# Patient Record
Sex: Male | Born: 1950 | Race: White | Hispanic: No | Marital: Married | Smoking: Never smoker
Health system: Southern US, Community
[De-identification: ages and names within clinical notes are randomized; demographics above are authoritative.]

## PROBLEM LIST (undated history)

## (undated) DIAGNOSIS — N401 Enlarged prostate with lower urinary tract symptoms: Secondary | ICD-10-CM

## (undated) DIAGNOSIS — N411 Chronic prostatitis: Secondary | ICD-10-CM

## (undated) DIAGNOSIS — I10 Essential (primary) hypertension: Secondary | ICD-10-CM

## (undated) DIAGNOSIS — N529 Male erectile dysfunction, unspecified: Secondary | ICD-10-CM

## (undated) DIAGNOSIS — N400 Enlarged prostate without lower urinary tract symptoms: Secondary | ICD-10-CM

## (undated) DIAGNOSIS — E785 Hyperlipidemia, unspecified: Secondary | ICD-10-CM

## (undated) HISTORY — DX: Benign prostatic hyperplasia with lower urinary tract symptoms: N40.1

## (undated) HISTORY — DX: Essential (primary) hypertension: I10

## (undated) HISTORY — DX: Chronic prostatitis: N41.1

## (undated) HISTORY — DX: Male erectile dysfunction, unspecified: N52.9

## (undated) HISTORY — DX: Hyperlipidemia, unspecified: E78.5

## (undated) HISTORY — DX: Benign prostatic hyperplasia without lower urinary tract symptoms: N40.0

---

## 2004-11-24 ENCOUNTER — Emergency Department: Payer: Self-pay | Admitting: Emergency Medicine

## 2004-11-24 ENCOUNTER — Other Ambulatory Visit: Payer: Self-pay

## 2005-04-28 HISTORY — PX: ELBOW ARTHROSCOPY: SUR87

## 2006-07-27 ENCOUNTER — Ambulatory Visit: Payer: Self-pay | Admitting: Chiropractic Medicine

## 2006-10-02 ENCOUNTER — Ambulatory Visit: Payer: Self-pay | Admitting: Cardiology

## 2007-01-25 ENCOUNTER — Ambulatory Visit (HOSPITAL_COMMUNITY): Admission: RE | Admit: 2007-01-25 | Discharge: 2007-01-25 | Payer: Self-pay | Admitting: Neurosurgery

## 2007-04-29 HISTORY — PX: CERVICAL DISC SURGERY: SHX588

## 2008-05-20 ENCOUNTER — Encounter: Admission: RE | Admit: 2008-05-20 | Discharge: 2008-05-20 | Payer: Self-pay | Admitting: Neurosurgery

## 2008-05-24 ENCOUNTER — Ambulatory Visit (HOSPITAL_COMMUNITY): Admission: RE | Admit: 2008-05-24 | Discharge: 2008-05-25 | Payer: Self-pay | Admitting: Neurosurgery

## 2008-09-06 ENCOUNTER — Encounter: Payer: Self-pay | Admitting: Internal Medicine

## 2008-09-27 ENCOUNTER — Encounter: Admission: RE | Admit: 2008-09-27 | Discharge: 2008-09-27 | Payer: Self-pay | Admitting: Neurosurgery

## 2009-04-28 HISTORY — PX: INGUINAL HERNIA REPAIR: SUR1180

## 2009-07-30 ENCOUNTER — Ambulatory Visit: Payer: Self-pay | Admitting: Surgery

## 2009-08-06 ENCOUNTER — Ambulatory Visit: Payer: Self-pay | Admitting: Surgery

## 2010-02-14 DIAGNOSIS — N401 Enlarged prostate with lower urinary tract symptoms: Secondary | ICD-10-CM | POA: Insufficient documentation

## 2010-02-14 DIAGNOSIS — N411 Chronic prostatitis: Secondary | ICD-10-CM

## 2010-02-14 HISTORY — DX: Benign prostatic hyperplasia with lower urinary tract symptoms: N40.1

## 2010-02-14 HISTORY — DX: Chronic prostatitis: N41.1

## 2010-04-28 HISTORY — PX: HYDROCELE EXCISION: SHX482

## 2010-08-12 LAB — CBC
HCT: 44.7 % (ref 39.0–52.0)
Hemoglobin: 15.2 g/dL (ref 13.0–17.0)
MCV: 92.6 fL (ref 78.0–100.0)
WBC: 5.9 10*3/uL (ref 4.0–10.5)

## 2010-08-12 LAB — BASIC METABOLIC PANEL
BUN: 15 mg/dL (ref 6–23)
Chloride: 107 mEq/L (ref 96–112)
GFR calc non Af Amer: >60 mL/min (ref >60)
Glucose, Bld: 99 mg/dL (ref 70–99)
Potassium: 4.5 mEq/L (ref 3.5–5.1)
Sodium: 139 mEq/L (ref 135–145)

## 2010-09-05 ENCOUNTER — Other Ambulatory Visit: Payer: Self-pay | Admitting: *Deleted

## 2010-09-05 MED ORDER — LISINOPRIL 20 MG PO TABS: 20.0000 mg | ORAL_TABLET | Freq: Every day | ORAL | Status: AC

## 2010-09-10 NOTE — Assessment & Plan Note (Signed)
[facility] OFFICE NOTE   TYRE, BEAVER                      MRN:          [id number]  DATE:10/02/2006                            DOB:          1950-12-07    Mr. Gregory Gilbert is a 60 year old married white male, who comes self  referred through a patient of mine, Dr. Maricela Curet, for establish  with me as his cardiologist.   He had an evaluation 2 years ago at [facility] with Dr. Arnoldo Hooker.  This was for funny feelings in his chest.  These were really  described as palpitations and dizziness.  He also had a lot of burping  and eructation.   He had, apparently, an endoscopy that was negative.  He was put on  Prilosec, which cured this.   He also had a stress echocardiogram by Dr. Gwen Pounds on December 06, 2004.  He exercised for 13 minutes and 41 seconds, achieving 15.1 METS.  Blood  pressure peaked at 173/102.  His EF  was 50%.  No wall motion  abnormalities, and his peak stress EF was 60%.  He had no EKG changes or  any abnormality of his heart rhythm.  It was interpreted as a negative  adequate study.   He has been doing remarkably well.  He has been under a lot of stress  lately with some family issues.  He has noticed that his blood pressure  has been good, but today is up.   He has had some orthopedic issues of his right elbow from racquetball,  which has made him not exercise quite a much as usual.  He does use a  recumbent bike.   His only risk factors at this point in time are his age, sex, and  hypertension.  He does smoke.  Does not have hyperlipidemia, and is not  diabetic.   He has no premature history of coronary disease in his family.   PAST MEDICAL HISTORY:  HE IS INTOLERANCE OF SULFA.   CURRENT MEDICATIONS:  1. Aspirin 81 mg a day.  2. Lisinopril 20 mg a day.  3. Protonix 40 mg a day.  4. Levoxadrol.   SURGICAL HISTORY:  Includes a right elbow repair at [facility] in  2007 from  racquetball.   SOCIAL HISTORY:  He is a Human resources officer.  He is married, and has 2  children.  He recently moved in to a new home.  He has been under a lot  of stress from family stresses.   REVIEW OF SYSTEMS:  Other than the HPI, is negative.  He does experience  some sexual dysfunction, and is on Levitra.   EXAMINATION:  His blood pressure is 142/82 in the left arm with a large  cuff.  I repeated it, it was still about 138 to 140 over about 85.  His  pulse is 54 in sinus brady.  His weight is 187.  He is 6 feet 2 inches.  His EKG is normal, except for a slight rightward axis.  He is very athletic looking.  HEENT:  Normocephalic,  atraumatic.  PERRLA.  Extraocular movements  intact.  Skin somewhat freckled, and he has a little bit of a  erythematous skin color.  Sclerae are clear.  Facial symmetry is normal.  Dentition is satisfactory.  Carotid upstrokes are equal bilaterally without bruits.  No JVD.  Thyroid is not enlarged.  Trachea is midline.  LUNGS:  Clear.  HEART:  Reveals a normal S1 and S2 without murmur, rub, or gallop.  ABDOMINAL EXAM:  Soft with good bowel sounds.  No tenderness.  EXTREMITIES:  Reveal no cyanosis, clubbing, or edema.  Pulses are  intact.   ASSESSMENT AND PLAN:  1. History of palpitations, normal exercise rest/stress echo, and      resolution with Protonix.  He also had a significant amount of      burping and belching, which is also resolved with Protonix.  2. Hypertension.  He has been under a lot of stress recently, and not      exercising quite a much.  I am concerned that he does not have      adequate control.  3. Gastroesophageal reflux.   RECOMMENDATIONS:  1. I renewed his lisinopril 20 mg a day per his request.  2. Blood pressure checks with a large cuff.  Goal is 120/80, anything      above 130/135, he should call us.  3. Get back in to an aerobic exercise program.  4. I will see him back otherwise in a year, or p.r.n.      Thomas C. Daleen Squibb, MD, [facility]  Electronically Signed    TCW/MedQ  DD: 10/02/2006  DT: 10/02/2006  Job #: [id number]   cc:   Fidela Juneau, MD

## 2010-09-10 NOTE — Op Note (Signed)
NAME:  Gregory Gilbert, Gregory Gilbert NO.:  [id number]   MEDICAL RECORD NO.:  [id number]          PATIENT TYPE:  OIB   LOCATION:  3534                         FACILITY:  [facility]   PHYSICIAN:  Cristi Loron, M.D.DATE OF BIRTH:  10-07-1950   DATE OF PROCEDURE:  05/24/2008  DATE OF DISCHARGE:  05/25/2008                               OPERATIVE REPORT   BRIEF HISTORY:  The patient is a 60 year old white male who has suffered  from neck and right arm pain consistent with a right C7 radiculopathy.  He failed medical management was worked up with a cervical MRI which  demonstrated herniated disk at C6-7 on the right.  I discussed the  various treatment options with the patient including surgery.  He has  weighed the risks, benefits and alternatives of surgery.  Decided to  proceed with a C6-7 anterior cervical diskectomy, fusion and plating.   PREOPERATIVE DIAGNOSES:  C6-7 herniated nucleus pulposus, spinal  stenosis, cervical radiculopathy, and cervicalgia.   POSTOPERATIVE DIAGNOSES:  C6-7 herniated nucleus pulposus, spinal  stenosis, cervical radiculopathy, and cervicalgia.   PROCEDURE:  C6-7 extensive anterior cervical diskectomy/decompression;  C6-7 anterior interbody arthrodesis with local morselized autograft bone  and Actifuse bone graft extender; insertion of C6-7 interbody prosthesis  (Neville PEEK interbody prosthesis); C6-7 anterior cervical plating with  CODMAN SLIM-LOC titanium plate and screws.   SURGEON:  Cristi Loron, MD   ASSISTANT:  Payton Doughty, MD   ANESTHESIA:  General endotracheal.   ESTIMATED BLOOD LOSS:  50 mL.   SPECIMENS:  None.   DRAINS:  None.   COMPLICATIONS:  None.   DESCRIPTION OF PROCEDURE:  The patient was brought to the operating room  by the Anesthesia Team.  General endotracheal anesthesia was induced.  The patient  remained in the supine position.  A roll was placed under  his shoulders placing the neck in slight extension.   His anterior  cervical region was then prepared with Betadine scrub and Betadine  solution.  Sterile drapes were applied.  I then injected the area to be  incised with Marcaine with epinephrine solution.  I used a scalpel to  make a transverse incision.  In the patient's left anterior neck, I used  the Metzenbaum scissors to divide the platysma muscle and then to  dissect medial to the sternocleidomastoid muscle, jugular vein and  carotid artery.  I carefully dissected down towards the anterior  cervical spine identifying the esophagus and retracting it medially.  I  used Kittner swabs to clear the soft tissue from the anterior cervical  spine.  We then inserted a bent spinal needle into the upper exposed  intervertebral disk space.  We obtained intraoperative radiograph to  confirm our location.   We then used electrocautery to detach the medial border of the longus  colli muscle bilaterally from the C6-7 intervertebral disk space.  We  then inserted the Caspar self-retaining retractor underneath the longus  colli muscle bilaterally to provide exposure.  We began the  decompression by incising the C6-7 intervertebral disk with the 15 blade  scalpel to perform a  partial intervertebral diskectomy using the  pituitary forceps and the Carlens curettes.  We inserted the distraction  screws at C6 and C7 and distracted the interspace.  We  then used a high-  speed drill to decorticate the vertebral endplates at C6-7, drilled away  the remainder of C6-7 intervertebral disk to  drill away some posterior  spondylosis and to thin out the posterior longitudinal ligament.  We  then incised the ligament with arachnoid knife and then removed it with  Kerrison punch undercutting the vertebral endplates decompressing the  thecal sac.  We then performed a foraminotomy about bilateral C7 nerve  roots completing the decompression.  I should also mention that we did  encounter the expected right-sided  herniated disk at C6-7 compressing  the right C7 nerve root.   Having completed the decompression, we now turned our attention to the  arthrodesis.  We used trial spacers and determined to use a 6-mm medium  Neville interbody prosthesis.  We prefilled this prosthesis with a  combination of local autograft bone that we obtained during  decompression as well as Actifuse bone graft extender.  We then placed  the prosthesis into distracted C6-7 interspace and then removed the  distraction screws.  There was a good snug fit of the prosthesis in the  interspace.   We now turned our attention to the anterior spinal instrumentation.  We  used the high-speed drill to remove some ventral spondylosis so the  plate would lay down flat.  We then selected appropriate length CODMAN  SLIM-LOC anterior cervical plate.  We laid it along the anterior aspect  of the vertebral bodies at C6-7.  We then drilled two 14-mm holes at C6  through C7.  We then secured the plate to the vertebral bodies by  placing two 14-mm self-tapping screws at C6 through C7.  We then  obtained intraoperative radiograph which demonstrated good position of  the plate, screws and interbody prosthesis.  We therefore secured these  screws and plate by locking each cam.   We then obtained hemostasis using bipolar cautery.  We irrigated the  wound out with bacitracin solution.  We then removed the retractor and  then inspected the esophagus for any damage, there was none apparent.  We then reapproximated the patient's platysma muscle with interrupted 3-  0 Vicryl suture, the subcutaneous tissue with interrupted 3-0 Vicryl  suture and the skin with Steri-Strips and benzoin.  The wound was then  coated with bacitracin ointment and sterile dressing was applied.  The  drapes were removed and the patient was subsequently extubated by the  Anesthesia Team and transported to the postanesthesia care unit in  stable condition.  All sponge,  instrument and needle counts were correct  at the end of this case.      Cristi Loron, M.D.  Electronically Signed     JDJ/MEDQ  D:  05/25/2008  T:  05/26/2008  Job:  [id number]

## 2010-10-10 ENCOUNTER — Encounter: Payer: Self-pay | Admitting: Internal Medicine

## 2011-01-04 IMAGING — CR DG CERVICAL SPINE 2 OR 3 VIEWS
1 series · 1 of 1 positions shown · non-contrast
Comparison: 05/20/2008.

CLINICAL DATA: C6-7 ACDF.

CERVICAL SPINE - 2-3 VIEW

[view not recorded]
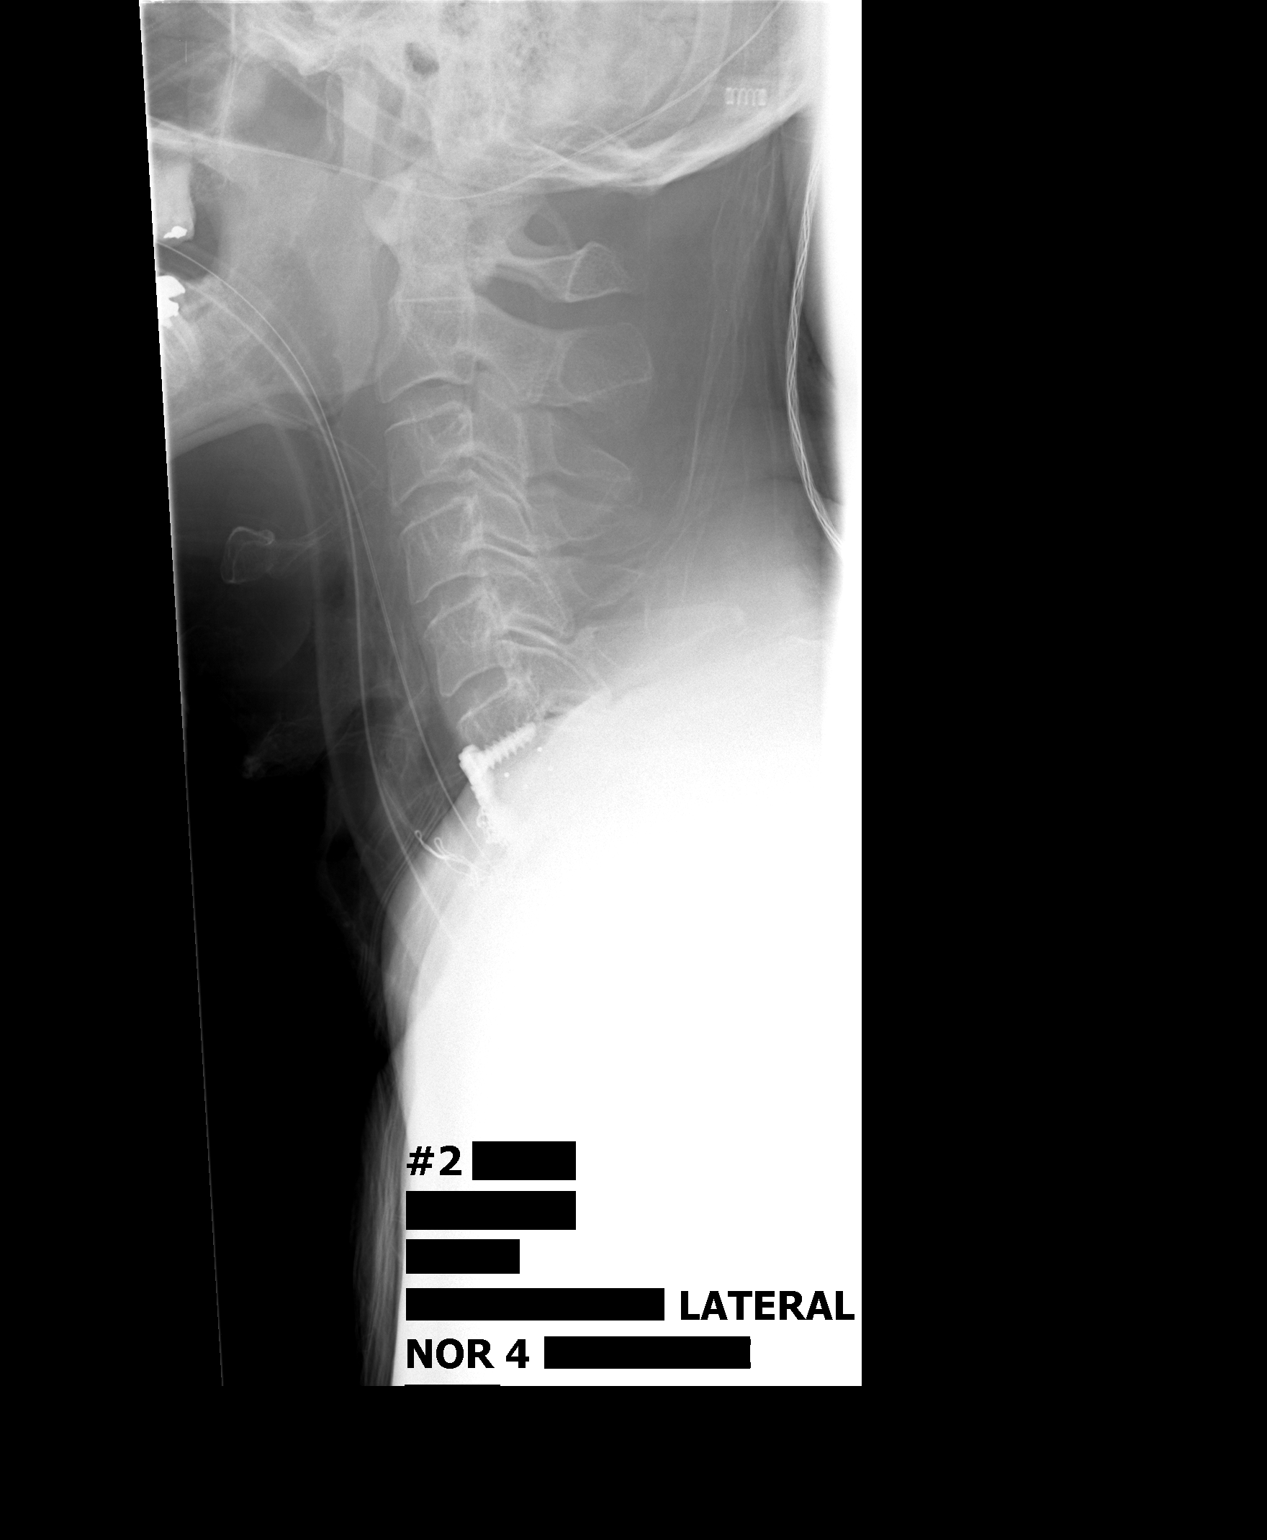

[1 of 1 positions shown; findings below may reference images not displayed]

FINDINGS: Image #1 reveals a needle at the C5-6 interspace.

Image #2 reveals anterior plate and interbody graft fusion at C6-7.
C7 is not well visualized due to overlying shoulders.
IMPRESSION: ACDF of C6-7.

## 2011-10-01 ENCOUNTER — Telehealth (HOSPITAL_COMMUNITY): Payer: Self-pay | Admitting: *Deleted

## 2011-10-01 NOTE — Telephone Encounter (Signed)
Leslie from [facility] called. Gregory Gilbert is in need of a refill on his medication, lisinopril 20mg  to be sent over to them.  Please call them back. Thanks.

## 2011-10-03 NOTE — Telephone Encounter (Signed)
Spoke [facility] advised pt was last seen by Dr Daleen Squibb in [place] office in 2008 we can not provide refills

## 2012-06-16 ENCOUNTER — Ambulatory Visit: Payer: Self-pay | Admitting: Urology

## 2012-06-23 ENCOUNTER — Ambulatory Visit: Payer: Self-pay | Admitting: Urology

## 2013-09-21 DIAGNOSIS — N529 Male erectile dysfunction, unspecified: Secondary | ICD-10-CM

## 2013-09-21 DIAGNOSIS — I10 Essential (primary) hypertension: Secondary | ICD-10-CM

## 2013-09-21 DIAGNOSIS — E785 Hyperlipidemia, unspecified: Secondary | ICD-10-CM

## 2013-09-21 HISTORY — DX: Essential (primary) hypertension: I10

## 2013-09-21 HISTORY — DX: Hyperlipidemia, unspecified: E78.5

## 2013-09-21 HISTORY — DX: Male erectile dysfunction, unspecified: N52.9

## 2013-09-22 DIAGNOSIS — N400 Enlarged prostate without lower urinary tract symptoms: Secondary | ICD-10-CM

## 2013-09-22 HISTORY — DX: Benign prostatic hyperplasia without lower urinary tract symptoms: N40.0

## 2014-07-28 ENCOUNTER — Ambulatory Visit
Admit: 2014-07-28 | Disposition: A | Discharge status: Home / Self Care | Payer: Self-pay | Attending: Unknown Physician Specialty | Admitting: Unknown Physician Specialty

## 2014-08-18 NOTE — Op Note (Signed)
PATIENT NAME:  Gregory Gilbert, Gregory Gilbert MR#:  [id number] DATE OF BIRTH:  [birth year]  DATE OF PROCEDURE:  06/23/2012  PREOPERATIVE DIAGNOSIS:  Right hydrocele.   POSTOPERATIVE DIAGNOSIS:   Right hydrocele.  POSTOPERATIVE DIAGNOSIS: Right hydrocelectomy.   SURGEON: Irineo AxonScott Stoioff, M.D.   ASSISTANT:  None.   ANESTHESIA:  General.   INDICATIONS:  The patient is a 64 year old male who developed a right hydrocele after an inguinal hernia repair. It had increased in size and is moderately symptomatic. He initially elected aspiration in the office, which was performed approximately 3 weeks ago. He states he had reaccumulation of the fluid within 72 hours. He has elected hydrocelectomy.   DESCRIPTION OF PROCEDURE: He was taken to the operating room where a general anesthetic was administered. He was then placed in the supine position and his external genitalia were prepped and draped in the usual fashion. Timeout was performed. A transverse incision was made in the midportion of the right hemiscrotum and carried through dartos fascia. The incision was carried down to the hydrocele sac. There was some adherence of the sac to the scrotal walls which was removed with blunt dissection and cautery. The hydrocele sac was then delivered in the operative field. It was incised anteriorly and approximately 200 mL of straw-colored fluid was aspirated. The hydrocele sac was opened longitudinally. The right testis was normal in appearance. The hydrocele sac was excised with a harmonic scalpel leaving a rim of approximately 1 cm on the cord structures and epididymis. The sac edges were in inverted and loosely approximated with 3-0 chromic suture. Hemostasis was active. The scrotal walls were examined and hemostasis was adequate.   The right testis was then delivered back in the right hemiscrotum in its anatomic position. Scrotum was irrigated with saline. A quarter-inch Penrose drain was placed in the dependent portion of the  right hemiscrotum through a separate stab incision and secured with 3-0 chromic. The skin edges were anesthetized with 8 mL of 0.5% plain Sensorcaine. The dartos was closed with a running 3-0 chromic suture. Skin was closed with a running horizontal mattress suture of 3-0 chromic. Dressing of Telfa and fluffs was applied. The patient was taken to the PAC-U in stable condition. There were no complications.   ESTIMATED BLOOD LOSS:  Minimal.    ____________________________ Verna CzechScott C. Lonna CobbStoioff, MD scs:ct D: 06/23/2012 11:16:43 ET T: 06/23/2012 11:36:28 ET JOB#: [id number]  cc: Lorin PicketScott C. Lonna CobbStoioff, MD, <Dictator> Riki AltesSCOTT C STOIOFF MD ELECTRONICALLY SIGNED 07/07/2012 7:44

## 2014-08-21 LAB — SURGICAL PATHOLOGY

## 2017-02-16 ENCOUNTER — Ambulatory Visit (INDEPENDENT_AMBULATORY_CARE_PROVIDER_SITE_OTHER): Payer: Medicare Other | Admitting: Urology

## 2017-02-16 ENCOUNTER — Encounter: Payer: Self-pay | Admitting: Urology

## 2017-02-16 VITALS — BP 138/77 | HR 65 | Ht 73.0 in | Wt 185.0 lb

## 2017-02-16 DIAGNOSIS — N400 Enlarged prostate without lower urinary tract symptoms: Secondary | ICD-10-CM

## 2017-02-16 DIAGNOSIS — N401 Enlarged prostate with lower urinary tract symptoms: Secondary | ICD-10-CM

## 2017-02-16 LAB — BLADDER SCAN AMB NON-IMAGING

## 2017-02-16 MED ORDER — TADALAFIL 5 MG PO TABS: 6.0000 mg | ORAL_TABLET | Freq: Every day | ORAL | 3 refills | Status: DC

## 2017-02-17 NOTE — Progress Notes (Signed)
02/16/2017 8:18 AM   Gregory Gilbert R Gilbert [birth year] [id number]   Chief Complaint  Patient presents with  . Benign Prostatic Hypertrophy    1year    HPI: 66 year old male presents for annual follow-up BPH.  [facility] and he noted significant worsening of his voiding symptoms after his insurance company was no longer covering daily low-dose tadalafil.  He has been obtaining this medication through [place] [facility] and did note significant improvement in his voiding pattern.  He remains on tamsulosin.  He presently has no bothersome lower urinary tract symptoms.  Denies dysuria or gross hematuria.  Denies flank, abdominal, pelvic or scrotal pain.  [facility]  PMH: Past Medical History:  Diagnosis Date  . Benign essential hypertension 09/21/2013  . Chronic prostatitis 02/14/2010  . 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 Enlarged prostate with lower urinary tract symptoms (LUTS) 02/14/2010  . Erectile dysfunction 09/21/2013  . Hyperlipemia 09/21/2013  . Hypertrophy of prostate without urinary obstruction and other lower urinary tract symptoms (LUTS) 09/22/2013    Surgical History: Past Surgical History:  Procedure Laterality Date  . CERVICAL DISC SURGERY  2009   6&7  . ELBOW ARTHROSCOPY Right 2007  . HYDROCELE EXCISION  2012  . INGUINAL HERNIA REPAIR  2011    Home Medications:  Allergies as of 02/16/2017      Reactions   Sulfa Antibiotics Nausea Only      Medication List       Accurate as of 02/16/17 11:59 PM. Always use your most recent med list.          aspirin EC 81 MG tablet Take 81 mg by mouth.   L-Lysine 500 MG Tabs Take by mouth.   lisinopril 20 MG tablet Commonly known as:  PRINIVIL,ZESTRIL Take 1 tablet (20 mg total) by mouth daily.   simvastatin 20 MG tablet Commonly known as:  ZOCOR   tadalafil 5 MG tablet Commonly known as:  CIALIS Take 1 tablet (5 mg total) by mouth  daily.   tamsulosin 0.4 MG Caps capsule Commonly known as:  FLOMAX   vitamin B-12 1000 MCG tablet Commonly known as:  CYANOCOBALAMIN Take by mouth.       Allergies:  Allergies  Allergen Reactions  . Sulfa Antibiotics Nausea Only    Family History: Family History  Problem Relation Age of Onset  . Bladder Cancer Neg Hx   . Kidney cancer Neg Hx   . Prostate cancer Neg Hx     Social History:  reports that he has never smoked. He has never used smokeless tobacco. He reports that he drinks alcohol. He reports that he does not use drugs.  ROS: UROLOGY Frequent Urination?: No Hard to postpone urination?: No Burning/pain with urination?: No Get up at night to urinate?: Yes Leakage of urine?: Yes Urine stream starts and stops?: No Trouble starting stream?: No Do you have to strain to urinate?: No Blood in urine?: No Urinary tract infection?: No Sexually transmitted disease?: No Injury to kidneys or bladder?: No Painful intercourse?: No Weak stream?: Yes Erection problems?: Yes Penile pain?: No  Gastrointestinal Nausea?: No Vomiting?: No Indigestion/heartburn?: No Diarrhea?: No Constipation?: No  Constitutional Fever: No Night sweats?: No Weight loss?: No Fatigue?: No  Skin Skin rash/lesions?: No Itching?: No  Eyes Blurred vision?: No Double vision?: No  Ears/Nose/Throat Sore throat?: No Sinus problems?: No  Hematologic/Lymphatic Swollen glands?: No Easy bruising?: No  Cardiovascular  Leg swelling?: No Chest pain?: No  Respiratory Cough?: No Shortness of breath?: No  Endocrine Excessive thirst?: No  Musculoskeletal Back pain?: No Joint pain?: No  Neurological Headaches?: No Dizziness?: No  Psychologic Depression?: No Anxiety?: No  Physical Exam: BP 138/77   Pulse 65   Ht 6\' 1"  (1.854 m)   Wt 185 lb (83.9 kg)   BMI 24.41 kg/m   Constitutional:  Alert and oriented, No acute distress. HEENT: [place] AT, moist mucus membranes.   Trachea midline, no masses. Cardiovascular: No clubbing, cyanosis, or edema. Respiratory: Normal respiratory effort, no increased work of breathing. GI: Abdomen is soft, nontender, nondistended, no abdominal masses GU: No CVA tenderness.  Prostate 50 g, smooth without nodules Skin: No rashes, bruises or suspicious lesions. Lymph: No cervical or inguinal adenopathy. Neurologic: Grossly intact, no focal deficits, moving all 4 extremities. Psychiatric: Normal mood and affect.  Laboratory Data: Lab Results  Component Value Date   WBC 5.9 05/24/2008   HGB 15.2 05/24/2008   HCT 44.7 05/24/2008   MCV 92.6 05/24/2008   PLT 171 05/24/2008    Lab Results  Component Value Date   CREATININE 0.81 05/24/2008     Assessment & Plan:   1. Benign prostatic hyperplasia with lower urinary tract symptoms, symptom details unspecified  Stable lower urinary tract symptoms on tamsulosin and tadalafil.  PVR by bladder scan today was 11 mL.  He was interested in obtaining extended release tadalafil 6 mg from [employer]. Continue annual follow-up.  - BLADDER SCAN AMB NON-IMAGING   Return in about 1 year (around 02/16/2018).  Riki Altes, MD  [facility] [address] [place], [place] [address] [phone]

## 2017-07-10 ENCOUNTER — Telehealth: Payer: Self-pay | Admitting: Urology

## 2017-07-10 NOTE — Telephone Encounter (Signed)
Pt called and has a few pills left.  He needs a refill.  [facility] [address] tried to [facility]  Can you please send refill to [facility] [address].

## 2017-07-14 MED ORDER — TAMSULOSIN HCL 0.4 MG PO CAPS: 0.4000 mg | ORAL_CAPSULE | Freq: Every day | ORAL | 11 refills | Status: DC

## 2017-07-14 NOTE — Telephone Encounter (Signed)
Flomax sent to [facility]

## 2017-12-02 ENCOUNTER — Ambulatory Visit (INDEPENDENT_AMBULATORY_CARE_PROVIDER_SITE_OTHER): Payer: Medicare Other | Admitting: Urology

## 2017-12-02 ENCOUNTER — Encounter: Payer: Self-pay | Admitting: Urology

## 2017-12-02 VITALS — BP 146/70 | HR 67 | Ht 74.0 in | Wt 190.0 lb

## 2017-12-02 DIAGNOSIS — N138 Other obstructive and reflux uropathy: Secondary | ICD-10-CM

## 2017-12-02 DIAGNOSIS — N401 Enlarged prostate with lower urinary tract symptoms: Secondary | ICD-10-CM

## 2017-12-03 ENCOUNTER — Encounter: Payer: Self-pay | Admitting: Urology

## 2017-12-03 NOTE — Progress Notes (Signed)
12/02/2017 9:19 AM   Gregory Gilbert 1950-12-01 [id number]  Referring provider: Kandyce Rud, MD [phone] [address] [facility] - [facility] Medicine [place], [place] [address]  Chief Complaint  Patient presents with  . Benign Prostatic Hypertrophy    Urolift discussion    HPI: 67 year old male followed for a long history of BPH with severe lower urinary tract symptoms.  He is presently on tamsulosin and compounded tadalafil and presents today to discuss UroLift.  His present voiding symptoms include decreased force and caliber of his urinary stream, urinary hesitancy and variable nocturia from 1-5.  He also complains of post void dribbling.  Denies dysuria or gross hematuria.  Denies flank, abdominal, pelvic or scrotal pain.  IPSS completed today was 20 with a quality of life rated 4/6.  Last PSA December 2018 was 0.85   PMH: Past Medical History:  Diagnosis Date  . Benign essential hypertension 09/21/2013  . Chronic prostatitis 02/14/2010  . 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 Enlarged prostate with lower urinary tract symptoms (LUTS) 02/14/2010  . Erectile dysfunction 09/21/2013  . Hyperlipemia 09/21/2013  . Hypertrophy of prostate without urinary obstruction and other lower urinary tract symptoms (LUTS) 09/22/2013    Surgical History: Past Surgical History:  Procedure Laterality Date  . CERVICAL DISC SURGERY  2009   6&7  . ELBOW ARTHROSCOPY Right 2007  . HYDROCELE EXCISION  2012  . INGUINAL HERNIA REPAIR  2011    Home Medications:  Allergies as of 12/02/2017      Reactions   Sulfa Antibiotics Nausea Only      Medication List        Accurate as of 12/02/17 11:59 PM. Always use your most recent med list.          aspirin EC 81 MG tablet Take 81 mg by mouth.   L-Lysine 500 MG Tabs Take by mouth.   lisinopril 20 MG tablet Commonly known as:  PRINIVIL,ZESTRIL Take 1 tablet (20 mg total) by mouth daily.   simvastatin 20 MG  tablet Commonly known as:  ZOCOR   tadalafil 5 MG tablet Commonly known as:  CIALIS Take 1 tablet (5 mg total) by mouth daily.   tamsulosin 0.4 MG Caps capsule Commonly known as:  FLOMAX Take 1 capsule (0.4 mg total) by mouth daily.   vitamin B-12 1000 MCG tablet Commonly known as:  CYANOCOBALAMIN Take by mouth.       Allergies:  Allergies  Allergen Reactions  . Sulfa Antibiotics Nausea Only    Family History: Family History  Problem Relation Age of Onset  . Bladder Cancer Neg Hx   . Kidney cancer Neg Hx   . Prostate cancer Neg Hx     Social History:  reports that he has never smoked. He has never used smokeless tobacco. He reports that he drinks alcohol. He reports that he does not use drugs.  ROS: UROLOGY Frequent Urination?: Yes Hard to postpone urination?: No Burning/pain with urination?: No Get up at night to urinate?: Yes Leakage of urine?: Yes Urine stream starts and stops?: No Trouble starting stream?: No Do you have to strain to urinate?: No Blood in urine?: No Urinary tract infection?: No Sexually transmitted disease?: No Injury to kidneys or bladder?: No Painful intercourse?: No Weak stream?: No Erection problems?: No Penile pain?: No  Gastrointestinal Nausea?: No Vomiting?: No Indigestion/heartburn?: No Diarrhea?: No Constipation?: No  Constitutional Fever: No Night sweats?: No Weight loss?: No Fatigue?: No  Skin Skin rash/lesions?: No Itching?: No  Eyes Blurred vision?: No Double vision?: No  Ears/Nose/Throat Sore throat?: No Sinus problems?: No  Hematologic/Lymphatic Swollen glands?: No Easy bruising?: No  Cardiovascular Leg swelling?: No Chest pain?: No  Respiratory Cough?: No Shortness of breath?: No  Endocrine Excessive thirst?: No  Musculoskeletal Back pain?: No Joint pain?: No  Neurological Headaches?: No Dizziness?: No  Psychologic Depression?: No Anxiety?: No  Physical Exam: BP (!) 146/70    Pulse 67   Ht 6\' 2"  (1.88 m)   Wt 190 lb (86.2 kg)   BMI 24.39 kg/m   Constitutional:  Alert and oriented, No acute distress. HEENT: [place] AT, moist mucus membranes.  Trachea midline, no masses. Cardiovascular: No clubbing, cyanosis, or edema.  RRR Respiratory: Normal respiratory effort, no increased work of breathing.  Lungs clear GI: Abdomen is soft, nontender, nondistended, no abdominal masses GU: No CVA tenderness Lymph: No cervical or inguinal lymphadenopathy. Skin: No rashes, bruises or suspicious lesions. Neurologic: Grossly intact, no focal deficits, moving all 4 extremities. Psychiatric: Normal mood and affect.   Assessment & Plan:   67 year old male with BPH and severe lower urinary tract symptoms on tamsulosin and tadalafil.  I discussed UroLift in detail including the procedure and the most common side effects of urinary frequency, urgency, dysuria and hematuria.  The possibility of persistent symptoms and rare chance of urinary retention was discussed.  He was informed the incidence of retrograde ejaculation is low.  He would like to think over and if he desires to pursue will schedule for cystoscopy/TRUS.   Riki AltesScott C Dillion Stowers, MD  [facility] [address], [address] [place], [place] [id number] [phone]

## 2017-12-07 ENCOUNTER — Encounter: Payer: Self-pay | Admitting: Urology

## 2017-12-10 ENCOUNTER — Telehealth: Payer: Self-pay | Admitting: Radiology

## 2017-12-10 NOTE — Telephone Encounter (Signed)
Reached out to patient regarding decision to pursue Urolift. Patient would like to discuss with wife & will call back with decision.

## 2018-02-15 ENCOUNTER — Ambulatory Visit: Payer: Medicare Other | Admitting: Urology

## 2018-02-16 ENCOUNTER — Encounter: Payer: Self-pay | Admitting: Urology

## 2018-02-16 ENCOUNTER — Ambulatory Visit (INDEPENDENT_AMBULATORY_CARE_PROVIDER_SITE_OTHER): Payer: Medicare Other | Admitting: Urology

## 2018-02-16 VITALS — BP 155/77 | HR 65 | Ht 74.0 in | Wt 192.4 lb

## 2018-02-16 DIAGNOSIS — N138 Other obstructive and reflux uropathy: Secondary | ICD-10-CM

## 2018-02-16 DIAGNOSIS — N401 Enlarged prostate with lower urinary tract symptoms: Secondary | ICD-10-CM | POA: Diagnosis not present

## 2018-02-16 LAB — BLADDER SCAN AMB NON-IMAGING

## 2018-02-16 NOTE — Progress Notes (Signed)
02/16/2018 2:52 PM   Gregory Gilbert [birth year] [id number]  Referring provider: Kandyce Rud, MD [phone] [address] [facility] - Family and Internal Medicine [place], [place] [address]  Chief Complaint  Patient presents with  . Benign Prostatic Hypertrophy    HPI: 67 year old male presents for annual follow-up of BPH.  He did make an appointment and August 2019 because he was interested in UroLift.  The procedure was discussed and he decided to hold off.  He states his voiding symptoms are stable but complains of decreased force and caliber of his urinary stream, hesitancy, postvoid dribbling and variable nocturia 2 times per night on average.   PMH: Past Medical History:  Diagnosis Date  . Benign essential hypertension 09/21/2013  . Chronic prostatitis 02/14/2010  . 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 Enlarged prostate with lower urinary tract symptoms (LUTS) 02/14/2010  . Erectile dysfunction 09/21/2013  . Hyperlipemia 09/21/2013  . Hypertrophy of prostate without urinary obstruction and other lower urinary tract symptoms (LUTS) 09/22/2013    Surgical History: Past Surgical History:  Procedure Laterality Date  . CERVICAL DISC SURGERY  2009   6&7  . ELBOW ARTHROSCOPY Right 2007  . HYDROCELE EXCISION  2012  . INGUINAL HERNIA REPAIR  2011    Home Medications:  Allergies as of 02/16/2018      Reactions   Sulfa Antibiotics Nausea Only      Medication List        Accurate as of 02/16/18  2:52 PM. Always use your most recent med list.          aspirin EC 81 MG tablet Take 81 mg by mouth.   L-Lysine 500 MG Tabs Take by mouth.   lisinopril 20 MG tablet Commonly known as:  PRINIVIL,ZESTRIL Take 1 tablet (20 mg total) by mouth daily.   simvastatin 20 MG tablet Commonly known as:  ZOCOR   tadalafil 5 MG tablet Commonly known as:  CIALIS Take 1 tablet (5 mg total) by mouth daily.   tamsulosin 0.4 MG Caps capsule Commonly known as:   FLOMAX Take 1 capsule (0.4 mg total) by mouth daily.   vitamin B-12 1000 MCG tablet Commonly known as:  CYANOCOBALAMIN Take by mouth.       Allergies:  Allergies  Allergen Reactions  . Sulfa Antibiotics Nausea Only    Family History: Family History  Problem Relation Age of Onset  . Bladder Cancer Neg Hx   . Kidney cancer Neg Hx   . Prostate cancer Neg Hx     Social History:  reports that he has never smoked. He has never used smokeless tobacco. He reports that he drinks alcohol. He reports that he does not use drugs.  ROS: UROLOGY Frequent Urination?: Yes Hard to postpone urination?: Yes Burning/pain with urination?: No Get up at night to urinate?: Yes Leakage of urine?: Yes Urine stream starts and stops?: Yes Trouble starting stream?: Yes Do you have to strain to urinate?: No Blood in urine?: No Urinary tract infection?: No Sexually transmitted disease?: No Injury to kidneys or bladder?: No Painful intercourse?: No Weak stream?: No Erection problems?: No Penile pain?: No  Gastrointestinal Nausea?: No Vomiting?: No Indigestion/heartburn?: No Diarrhea?: No Constipation?: No  Constitutional Fever: No Night sweats?: No Weight loss?: No Fatigue?: No  Skin Skin rash/lesions?: No Itching?: No  Eyes Blurred vision?: No Double vision?: No  Ears/Nose/Throat Sore throat?: No Sinus problems?: No  Hematologic/Lymphatic Swollen glands?: No Easy bruising?: No  Cardiovascular  Leg swelling?: No Chest pain?: No  Respiratory Cough?: No Shortness of breath?: No  Endocrine Excessive thirst?: No  Musculoskeletal Back pain?: No Joint pain?: No  Neurological Headaches?: No Dizziness?: No  Psychologic Depression?: No Anxiety?: No  Physical Exam: BP (!) 155/77 (BP Location: Left Arm, Patient Position: Sitting, Cuff Size: Normal)   Pulse 65   Ht 6\' 2"  (1.88 m)   Wt 192 lb 6.4 oz (87.3 kg)   BMI 24.70 kg/m   Constitutional:  Alert and  oriented, No acute distress.    Assessment & Plan:   67 year old male with moderate to severe lower urinary tract symptoms on tamsulosin/tadalafil.  He is still interested in UroLift.  We again discussed the procedure.  He would like to go ahead and schedule cystoscopy and TRUS to determine if he is a UroLift candidate.  DRE at the time of TRUS.    Riki Altes, MD  [facility] [address] [place], [place] [address] [phone]

## 2018-02-18 ENCOUNTER — Encounter: Payer: Self-pay | Admitting: Urology

## 2018-02-18 ENCOUNTER — Ambulatory Visit (INDEPENDENT_AMBULATORY_CARE_PROVIDER_SITE_OTHER): Payer: Medicare Other | Admitting: Urology

## 2018-02-18 ENCOUNTER — Other Ambulatory Visit: Payer: Self-pay | Admitting: Radiology

## 2018-02-18 VITALS — BP 144/80 | HR 67 | Ht 74.0 in | Wt 190.0 lb

## 2018-02-18 DIAGNOSIS — N138 Other obstructive and reflux uropathy: Secondary | ICD-10-CM

## 2018-02-18 DIAGNOSIS — N401 Enlarged prostate with lower urinary tract symptoms: Secondary | ICD-10-CM | POA: Diagnosis not present

## 2018-02-18 MED ORDER — LIDOCAINE HCL URETHRAL/MUCOSAL 2 % EX GEL: 1.0000 "application " | Freq: Once | CUTANEOUS | Status: DC

## 2018-02-19 NOTE — H&P (View-Only) (Signed)
02/18/2018 4:16 PM   Gregory Gilbert 09/10/1950 [id number]  Referring provider: Kandyce Rud, MD [phone] [address] [facility] - [facility] and Internal Medicine [place], [place] [address]  Chief Complaint  Patient presents with  . Cysto    HPI: 67 year old male with BPH and bothersome lower urinary tract symptoms who is interested in proceeding with UroLift.  He is on tamsulosin and tadalafil.  Voiding symptoms include decreased force and caliber of urinary stream, hesitancy, postvoid dribbling and variable nocturia.  IPSS was 20/35.  Cystoscopy performed today shows no obstructive median lobe.  He also presents for transrectal ultrasound for volume measurement.   PMH: Past Medical History:  Diagnosis Date  . Benign essential hypertension 09/21/2013  . Chronic prostatitis 02/14/2010  . 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 Enlarged prostate with lower urinary tract symptoms (LUTS) 02/14/2010  . Erectile dysfunction 09/21/2013  . Hyperlipemia 09/21/2013  . Hypertrophy of prostate without urinary obstruction and other lower urinary tract symptoms (LUTS) 09/22/2013    Surgical History: Past Surgical History:  Procedure Laterality Date  . CERVICAL DISC SURGERY  2009   6&7  . ELBOW ARTHROSCOPY Right 2007  . HYDROCELE EXCISION  2012  . INGUINAL HERNIA REPAIR  2011    Home Medications:  Allergies as of 02/18/2018      Reactions   Sulfa Antibiotics Nausea Only      Medication List        Accurate as of 02/18/18 11:59 PM. Always use your most recent med list.          aspirin EC 81 MG tablet Take 81 mg by mouth.   L-Lysine 500 MG Tabs Take by mouth.   lisinopril 20 MG tablet Commonly known as:  PRINIVIL,ZESTRIL Take 1 tablet (20 mg total) by mouth daily.   simvastatin 20 MG tablet Commonly known as:  ZOCOR   tadalafil 5 MG tablet Commonly known as:  CIALIS Take 1 tablet (5 mg total) by mouth daily.   tamsulosin 0.4 MG Caps  capsule Commonly known as:  FLOMAX Take 1 capsule (0.4 mg total) by mouth daily.   vitamin B-12 1000 MCG tablet Commonly known as:  CYANOCOBALAMIN Take by mouth.       Allergies:  Allergies  Allergen Reactions  . Sulfa Antibiotics Nausea Only    Family History: Family History  Problem Relation Age of Onset  . Bladder Cancer Neg Hx   . Kidney cancer Neg Hx   . Prostate cancer Neg Hx     Social History:  reports that he has never smoked. He has never used smokeless tobacco. He reports that he drinks alcohol. He reports that he does not use drugs.  ROS: No significant change from 12/02/2017  Physical Exam: BP (!) 144/80 (BP Location: Left Arm, Patient Position: Sitting, Cuff Size: Normal)   Pulse 67   Ht 6\' 2"  (1.88 m)   Wt 190 lb (86.2 kg)   BMI 24.39 kg/m   Constitutional:  Alert and oriented, No acute distress. HEENT: [place] AT, moist mucus membranes.  Trachea midline, no masses. Cardiovascular: No clubbing, cyanosis, or edema.  RRR Respiratory: Normal respiratory effort, no increased work of breathing.  Clear GI: Abdomen is soft, nontender, nondistended, no abdominal masses GU: No CVA tenderness Lymph: No cervical or inguinal lymphadenopathy. Skin: No rashes, bruises or suspicious lesions. Neurologic: Grossly intact, no focal deficits, moving all 4 extremities. Psychiatric: Normal mood and affect.  Pertinent Imaging:  TRUS: A transrectal ultrasound  probe was dictated and gently inserted per rectum.  The prostate volume was calculated at 64 cc.  No echogenic abnormalities were noted.  Assessment & Plan:   67 year old male with moderate to severe lower urinary tract symptoms on medical management.  He is a candidate for UroLift and would like to schedule.  The procedure was discussed in detail including most common side effects of postoperative frequency, urgency, dysuria and hematuria which can last for several weeks.  Although there is a high percentage his voiding  symptoms will improve he understands there is no guarantee that this procedure will resolve or improve his symptoms.  The extremity low incidence of sexual side effects including retrograde ejaculation was discussed.  He indicated all questions were answered and desires to proceed.    Riki Altes, MD  [facility] [address] [place], [place] [address] [phone]

## 2018-02-19 NOTE — Progress Notes (Signed)
 02/18/2018 4:16 PM   Gregory Gilbert 05/24/1950 [id number]  Referring provider: Babaoff, Marcus, MD [address] [facility] [place] - Family and Internal Medicine [place], [place] [address]  Chief Complaint  Patient presents with  . Cysto    HPI: 67-year-old male with BPH and bothersome lower urinary tract symptoms who is interested in proceeding with UroLift.  He is on tamsulosin and tadalafil.  Voiding symptoms include decreased force and caliber of urinary stream, hesitancy, postvoid dribbling and variable nocturia.  IPSS was 20/35.  Cystoscopy performed today shows no obstructive median lobe.  He also presents for transrectal ultrasound for volume measurement.   PMH: Past Medical History:  Diagnosis Date  . Benign essential hypertension 09/21/2013  . Chronic prostatitis 02/14/2010  . 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 Enlarged prostate with lower urinary tract symptoms (LUTS) 02/14/2010  . Erectile dysfunction 09/21/2013  . Hyperlipemia 09/21/2013  . Hypertrophy of prostate without urinary obstruction and other lower urinary tract symptoms (LUTS) 09/22/2013    Surgical History: Past Surgical History:  Procedure Laterality Date  . CERVICAL DISC SURGERY  2009   6&7  . ELBOW ARTHROSCOPY Right 2007  . HYDROCELE EXCISION  2012  . INGUINAL HERNIA REPAIR  2011    Home Medications:  Allergies as of 02/18/2018      Reactions   Sulfa Antibiotics Nausea Only      Medication List        Accurate as of 02/18/18 11:59 PM. Always use your most recent med list.          aspirin EC 81 MG tablet Take 81 mg by mouth.   L-Lysine 500 MG Tabs Take by mouth.   lisinopril 20 MG tablet Commonly known as:  PRINIVIL,ZESTRIL Take 1 tablet (20 mg total) by mouth daily.   simvastatin 20 MG tablet Commonly known as:  ZOCOR   tadalafil 5 MG tablet Commonly known as:  CIALIS Take 1 tablet (5 mg total) by mouth daily.   tamsulosin 0.4 MG Caps  capsule Commonly known as:  FLOMAX Take 1 capsule (0.4 mg total) by mouth daily.   vitamin B-12 1000 MCG tablet Commonly known as:  CYANOCOBALAMIN Take by mouth.       Allergies:  Allergies  Allergen Reactions  . Sulfa Antibiotics Nausea Only    Family History: Family History  Problem Relation Age of Onset  . Bladder Cancer Neg Hx   . Kidney cancer Neg Hx   . Prostate cancer Neg Hx     Social History:  reports that he has never smoked. He has never used smokeless tobacco. He reports that he drinks alcohol. He reports that he does not use drugs.  ROS: No significant change from 12/02/2017  Physical Exam: BP (!) 144/80 (BP Location: Left Arm, Patient Position: Sitting, Cuff Size: Normal)   Pulse 67   Ht 6' 2" (1.88 m)   Wt 190 lb (86.2 kg)   BMI 24.39 kg/m   Constitutional:  Alert and oriented, No acute distress. HEENT: [place] AT, moist mucus membranes.  Trachea midline, no masses. Cardiovascular: No clubbing, cyanosis, or edema.  RRR Respiratory: Normal respiratory effort, no increased work of breathing.  Clear GI: Abdomen is soft, nontender, nondistended, no abdominal masses GU: No CVA tenderness Lymph: No cervical or inguinal lymphadenopathy. Skin: No rashes, bruises or suspicious lesions. Neurologic: Grossly intact, no focal deficits, moving all 4 extremities. Psychiatric: Normal mood and affect.  Pertinent Imaging:  TRUS: A transrectal ultrasound   probe was dictated and gently inserted per rectum.  The prostate volume was calculated at 64 cc.  No echogenic abnormalities were noted.  Assessment & Plan:   67-year-old male with moderate to severe lower urinary tract symptoms on medical management.  He is a candidate for UroLift and would like to schedule.  The procedure was discussed in detail including most common side effects of postoperative frequency, urgency, dysuria and hematuria which can last for several weeks.  Although there is a high percentage his voiding  symptoms will improve he understands there is no guarantee that this procedure will resolve or improve his symptoms.  The extremity low incidence of sexual side effects including retrograde ejaculation was discussed.  He indicated all questions were answered and desires to proceed.    Quention Mcneill C Abdelrahman Nair, MD  [facility] [address], [address] [place], [place] [address] [phone]  

## 2018-03-04 ENCOUNTER — Encounter
Admission: RE | Admit: 2018-03-04 | Discharge: 2018-03-04 | Disposition: A | Discharge status: Home / Self Care | Payer: Medicare Other | Source: Ambulatory Visit | Attending: Urology | Admitting: Urology

## 2018-03-04 ENCOUNTER — Other Ambulatory Visit: Payer: Self-pay

## 2018-03-04 DIAGNOSIS — Z01818 Encounter for other preprocedural examination: Secondary | ICD-10-CM | POA: Diagnosis present

## 2018-03-04 NOTE — Patient Instructions (Signed)
Your procedure is scheduled on: Tues. 11/19 Report to Day Surgery. To find out your arrival time please call [phone] between 1PM - 3PM on Mon. 11/18.  Remember: Instructions that are not followed completely may result in serious medical risk,  up to and including death, or upon the discretion of your surgeon and anesthesiologist your  surgery may need to be rescheduled.     _X__ 1. Do not eat food after midnight the night before your procedure.                 No gum chewing or hard candies. You may drink clear liquids up to 2 hours                 before you are scheduled to arrive for your surgery- DO not drink clear                 liquids within 2 hours of the start of your surgery.                 Clear Liquids include:  water, apple juice without pulp, clear carbohydrate                 drink such as Clearfast of Gatorade, Black Coffee or Tea (Do not add                 anything to coffee or tea).  __X__2.  On the morning of surgery brush your teeth with toothpaste and water, you                may rinse your mouth with mouthwash if you wish.  Do not swallow any toothpaste of mouthwash.     _X__ 3.  No Alcohol for 24 hours before or after surgery.   ___ 4.  Do Not Smoke or use e-cigarettes For 24 Hours Prior to Your Surgery.                 Do not use any chewable tobacco products for at least 6 hours prior to                 surgery.  ____  5.  Bring all medications with you on the day of surgery if instructed.   __x__  6.  Notify your doctor if there is any change in your medical condition      (cold, fever, infections).     Do not wear jewelry, make-up, hairpins, clips or nail polish. Do not wear lotions, powders, or perfumes. You may wear deodorant. Do not shave 48 hours prior to surgery. Men may shave face and neck. Do not bring valuables to the hospital.    [facility] is not responsible for any belongings or valuables.  Contacts,  dentures or bridgework may not be worn into surgery. Leave your suitcase in the car. After surgery it may be brought to your room. For patients admitted to the hospital, discharge time is determined by your treatment team.   Patients discharged the day of surgery will not be allowed to drive home.   Please read over the following fact sheets that you were given:    __x__ Take these medicines the morning of surgery with A SIP OF WATER:    1.   2.   3.   4.  5.  6.  ____ Fleet Enema (as directed)   ____ Use CHG Soap as directed  ____ Use inhalers on the day of  surgery  ____ Stop metformin 2 days prior to surgery    ____ Take 1/2 of usual insulin dose the night before surgery. No insulin the morning          of surgery.   _x___ Stop aspirin 11/12  _x___ Stop Anti-inflammatories on 11/12naproxen sodium (ALEVE) 220 MG tablet   __x__ Stop supplements on 11/12Saw Palmetto 450 MG CAPS  ____ Bring C-Pap to the hospital.

## 2018-03-05 LAB — URINE CULTURE: CULTURE: NO GROWTH

## 2018-03-11 ENCOUNTER — Other Ambulatory Visit: Payer: Medicare Other | Admitting: Urology

## 2018-03-15 MED ORDER — CEFAZOLIN SODIUM-DEXTROSE 2-4 GM/100ML-% IV SOLN
2.0000 g | INTRAVENOUS | Status: AC
  Administered 2018-03-16: 2 g via INTRAVENOUS

## 2018-03-16 ENCOUNTER — Ambulatory Visit: Payer: Medicare Other | Admitting: Anesthesiology

## 2018-03-16 ENCOUNTER — Other Ambulatory Visit: Payer: Self-pay

## 2018-03-16 ENCOUNTER — Encounter
Admission: RE | Disposition: A | Discharge status: Home / Self Care | Payer: Self-pay | Source: Ambulatory Visit | Attending: Urology

## 2018-03-16 ENCOUNTER — Ambulatory Visit
Admission: RE | Admit: 2018-03-16 | Discharge: 2018-03-16 | Disposition: A | Discharge status: Home / Self Care | Payer: Medicare Other | Source: Ambulatory Visit | Attending: Urology | Admitting: Urology

## 2018-03-16 ENCOUNTER — Other Ambulatory Visit: Payer: Self-pay | Admitting: Urology

## 2018-03-16 DIAGNOSIS — Z7982 Long term (current) use of aspirin: Secondary | ICD-10-CM | POA: Insufficient documentation

## 2018-03-16 DIAGNOSIS — E785 Hyperlipidemia, unspecified: Secondary | ICD-10-CM | POA: Insufficient documentation

## 2018-03-16 DIAGNOSIS — Z79899 Other long term (current) drug therapy: Secondary | ICD-10-CM | POA: Insufficient documentation

## 2018-03-16 DIAGNOSIS — N401 Enlarged prostate with lower urinary tract symptoms: Secondary | ICD-10-CM | POA: Insufficient documentation

## 2018-03-16 DIAGNOSIS — Z882 Allergy status to sulfonamides status: Secondary | ICD-10-CM | POA: Diagnosis not present

## 2018-03-16 DIAGNOSIS — I10 Essential (primary) hypertension: Secondary | ICD-10-CM | POA: Insufficient documentation

## 2018-03-16 DIAGNOSIS — N138 Other obstructive and reflux uropathy: Secondary | ICD-10-CM | POA: Diagnosis not present

## 2018-03-16 HISTORY — PX: CYSTOSCOPY WITH INSERTION OF UROLIFT: SHX6678

## 2018-03-16 SURGERY — CYSTOSCOPY WITH INSERTION OF UROLIFT
Anesthesia: General

## 2018-03-16 MED ORDER — GLYCOPYRROLATE 0.2 MG/ML IJ SOLN
INTRAMUSCULAR | Status: AC
  Filled 2018-03-16: qty 1

## 2018-03-16 MED ORDER — LIDOCAINE HCL URETHRAL/MUCOSAL 2 % EX GEL
CUTANEOUS | Status: AC
  Filled 2018-03-16: qty 5

## 2018-03-16 MED ORDER — LACTATED RINGERS IV SOLN
INTRAVENOUS | Status: DC
  Administered 2018-03-16 (×2): via INTRAVENOUS

## 2018-03-16 MED ORDER — SUCCINYLCHOLINE CHLORIDE 20 MG/ML IJ SOLN
INTRAMUSCULAR | Status: AC
  Filled 2018-03-16: qty 1

## 2018-03-16 MED ORDER — PROPOFOL 10 MG/ML IV BOLUS
INTRAVENOUS | Status: AC
  Filled 2018-03-16: qty 20

## 2018-03-16 MED ORDER — FENTANYL CITRATE (PF) 100 MCG/2ML IJ SOLN
INTRAMUSCULAR | Status: AC
  Filled 2018-03-16: qty 2

## 2018-03-16 MED ORDER — ACETAMINOPHEN 10 MG/ML IV SOLN
INTRAVENOUS | Status: AC
  Filled 2018-03-16: qty 100

## 2018-03-16 MED ORDER — FAMOTIDINE 20 MG PO TABS
ORAL_TABLET | ORAL | Status: AC
  Administered 2018-03-16: 20 mg via ORAL
  Filled 2018-03-16: qty 1

## 2018-03-16 MED ORDER — PROPOFOL 10 MG/ML IV BOLUS
INTRAVENOUS | Status: DC | PRN
  Administered 2018-03-16: 150 mg via INTRAVENOUS
  Administered 2018-03-16: 50 mg via INTRAVENOUS

## 2018-03-16 MED ORDER — FENTANYL CITRATE (PF) 100 MCG/2ML IJ SOLN
INTRAMUSCULAR | Status: DC | PRN
  Administered 2018-03-16: 50 ug via INTRAVENOUS
  Administered 2018-03-16: 25 ug via INTRAVENOUS

## 2018-03-16 MED ORDER — GLYCOPYRROLATE 0.2 MG/ML IJ SOLN
INTRAMUSCULAR | Status: DC | PRN
  Administered 2018-03-16: .2 mg via INTRAVENOUS

## 2018-03-16 MED ORDER — LIDOCAINE HCL (PF) 2 % IJ SOLN
INTRAMUSCULAR | Status: AC
  Filled 2018-03-16: qty 10

## 2018-03-16 MED ORDER — CEFAZOLIN SODIUM-DEXTROSE 2-4 GM/100ML-% IV SOLN
INTRAVENOUS | Status: AC
  Filled 2018-03-16: qty 100

## 2018-03-16 MED ORDER — MIDAZOLAM HCL 2 MG/2ML IJ SOLN
INTRAMUSCULAR | Status: AC
  Filled 2018-03-16: qty 2

## 2018-03-16 MED ORDER — EPHEDRINE SULFATE 50 MG/ML IJ SOLN
INTRAMUSCULAR | Status: DC | PRN
  Administered 2018-03-16: 10 mg via INTRAVENOUS

## 2018-03-16 MED ORDER — PHENYLEPHRINE HCL 10 MG/ML IJ SOLN
INTRAMUSCULAR | Status: DC | PRN
  Administered 2018-03-16: 150 ug via INTRAVENOUS
  Administered 2018-03-16 (×3): 100 ug via INTRAVENOUS

## 2018-03-16 MED ORDER — FAMOTIDINE 20 MG PO TABS
20.0000 mg | ORAL_TABLET | Freq: Once | ORAL | Status: AC
  Administered 2018-03-16: 20 mg via ORAL

## 2018-03-16 MED ORDER — TADALAFIL 5 MG PO TABS: 5.0000 mg | ORAL_TABLET | Freq: Every day | ORAL | 3 refills | Status: DC

## 2018-03-16 MED ORDER — MIDAZOLAM HCL 2 MG/2ML IJ SOLN
INTRAMUSCULAR | Status: DC | PRN
  Administered 2018-03-16 (×2): 1 mg via INTRAVENOUS

## 2018-03-16 MED ORDER — ONDANSETRON HCL 4 MG/2ML IJ SOLN
INTRAMUSCULAR | Status: DC | PRN
  Administered 2018-03-16: 4 mg via INTRAVENOUS

## 2018-03-16 MED ORDER — FENTANYL CITRATE (PF) 100 MCG/2ML IJ SOLN: 25.0000 ug | INTRAMUSCULAR | Status: DC | PRN

## 2018-03-16 MED ORDER — ACETAMINOPHEN 10 MG/ML IV SOLN
INTRAVENOUS | Status: DC | PRN
  Administered 2018-03-16: 1000 mg via INTRAVENOUS

## 2018-03-16 SURGICAL SUPPLY — 13 items
BAG DRAIN CYSTO-URO LG1000N (MISCELLANEOUS) ×3 IMPLANT
GLOVE BIO SURGEON STRL SZ8 (GLOVE) ×3 IMPLANT
GOWN STRL REUS W/ TWL LRG LVL3 (GOWN DISPOSABLE) ×1 IMPLANT
GOWN STRL REUS W/ TWL XL LVL3 (GOWN DISPOSABLE) ×1 IMPLANT
GOWN STRL REUS W/TWL LRG LVL3 (GOWN DISPOSABLE) ×2
GOWN STRL REUS W/TWL XL LVL3 (GOWN DISPOSABLE) ×2
KIT TURNOVER CYSTO (KITS) ×3 IMPLANT
PACK CYSTO AR (MISCELLANEOUS) ×3 IMPLANT
SET CYSTO W/LG BORE CLAMP LF (SET/KITS/TRAYS/PACK) ×3 IMPLANT
SURGILUBE 2OZ TUBE FLIPTOP (MISCELLANEOUS) ×3 IMPLANT
SYSTEM UROLIFT (Male Continence) ×12 IMPLANT
WATER STERILE IRR 1000ML POUR (IV SOLUTION) ×3 IMPLANT
WATER STERILE IRR 3000ML UROMA (IV SOLUTION) ×3 IMPLANT

## 2018-03-16 NOTE — Op Note (Signed)
Preoperative diagnosis: BPH with obstructive symptomatology  Postoperative diagnosis: BPH with obstructive symptomatology  Principal procedure: Urolift procedure, with the placement of 6 implants.  Surgeon: Lonna CobbStoioff  Anesthesia: General  Complications: None  Estimated blood loss: < 5 mL  Indications: 67 year-old male with obstructive symptomatology secondary to BPH.  The patient's symptoms have progressed, and he has requested further management.  Management options including TURP with resection/ablation of the prostate as well as Urolift were discussed.  The patient has chosen to have a Urolift procedure.  He has been instructed to the procedure as well as risks and complications which include but are not limited to infection, bleeding, and inadequate treatment with the Urolift procedure alone, anesthetic complications, among others.  He understands these and desires to proceed.  Findings: Using the 17 French cystoscope, urethra and bladder were inspected.  There were no urethral lesions.  Prostatic urethra was obstructed secondary to bilobar hypertrophy.  The bladder was inspected circumferentially.  This revealed normal findings.  Description of procedure: The patient was properly identified in the holding area.  He received preoperative IV antibiotics.  He was taken to the operating room where general anesthetic was administered with the LMA.  He is placed in the dorsolithotomy position.  Genitalia and perineum were prepped and draped.  Proper timeout was performed.  A 22F cystoscope was inserted into the bladder. The cystoscopy bridge was replaced with a UroLift delivery device.The first treatment site was the patient's right side approximately 1.5cm distal to the bladder neck. The distal tip of the delivery device was then angled laterally approximately 20 degrees at this position to compress the lateral lobe.  The trigger was pulled, thereby deploying a needle containing the implant through  the prostate.  The needle was then retracted, allowing one end of the implant to be delivered to the capsular surface of the prostate.  The implant was then tensioned to assure capsular seating and removal of slack monofilament.  The device was then angled back toward midline and slowly advanced proximally until cystoscopic verification of the monofilament being centered in the delivery bay. The urethral end piece was then affixed to the monofilament thereby tailoring the size of the implant.  Excess filament was then severed.  The delivery device was then re-advanced into the bladder. The delivery device was then replaced with cystoscope and bridge and the implant location and opening effect was confirmed cystoscopically.  The same procedure was then repeated on the left side, and 2 additional implants were delivered just proximal to the verumontanum, again one on right and one on left side of the prostate, following the same technique.  Repeat cystoscopy was performed and 2 additional implants were placed in the mid prostatic urethra with the identical technique    A final cystoscopy was conducted first to inspect the location and state of each implant and second, to confirm the presence of a continuous anterior channel was present through the prostatic urethra with irrigation flow turned off.  Six Implants were delivered in total.   He was then awakened and taken to the PACU in stable condition.  He tolerated the procedure well.   Linnaea Ahn [employer] CorporationStoioff. MD

## 2018-03-16 NOTE — Interval H&P Note (Signed)
History and Physical Interval Note:  03/16/2018 7:16 AM  Gregory Gilbert  has presented today for surgery, with the diagnosis of BPH with lower urinary tract symptoms  The various methods of treatment have been discussed with the patient and family. After consideration of risks, benefits and other options for treatment, the patient has consented to  Procedure(s): CYSTOSCOPY WITH INSERTION OF UROLIFT (N/A) as a surgical intervention .  The patient's history has been reviewed, patient examined, no change in status, stable for surgery.  I have reviewed the patient's chart and labs.  Questions were answered to the patient's satisfaction.     Scott C Stoioff

## 2018-03-16 NOTE — Discharge Instructions (Signed)

## 2018-03-16 NOTE — Anesthesia Postprocedure Evaluation (Signed)
Anesthesia Post Note  Patient: Clide DalesJohn R Dobler  Procedure(s) Performed: CYSTOSCOPY WITH INSERTION OF UROLIFT (N/A )  Patient location during evaluation: PACU Anesthesia Type: General Level of consciousness: awake and alert Pain management: pain level controlled Vital Signs Assessment: post-procedure vital signs reviewed and stable Respiratory status: spontaneous breathing, nonlabored ventilation, respiratory function stable and patient connected to nasal cannula oxygen Cardiovascular status: blood pressure returned to baseline and stable Postop Assessment: no apparent nausea or vomiting Anesthetic complications: no     Last Vitals:  Vitals:   03/16/18 0853 03/16/18 0903  BP: 121/89 123/78  Pulse: 65 60  Resp: 14 14  Temp: (!) 36.3 C   SpO2: 97% 99%    Last Pain:  Vitals:   03/16/18 0903  TempSrc:   PainSc: 0-No pain                 Cleda MccreedyJoseph K Isador Castille

## 2018-03-16 NOTE — Anesthesia Procedure Notes (Signed)
Procedure Name: LMA Insertion Date/Time: 03/16/2018 7:37 AM Performed by: Henrietta HooverPope, Samatha Anspach, CRNA Pre-anesthesia Checklist: Patient identified, Patient being monitored, Timeout performed, Emergency Drugs available and Suction available Patient Re-evaluated:Patient Re-evaluated prior to induction Oxygen Delivery Method: Circle system utilized Preoxygenation: Pre-oxygenation with 100% oxygen Induction Type: IV induction Ventilation: Mask ventilation without difficulty LMA: LMA inserted LMA Size: 5.0 Tube type: Oral Number of attempts: 1 Placement Confirmation: positive ETCO2 and breath sounds checked- equal and bilateral Tube secured with: Tape Dental Injury: Teeth and Oropharynx as per pre-operative assessment

## 2018-03-16 NOTE — Transfer of Care (Signed)
Immediate Anesthesia Transfer of Care Note  Patient: Gregory DalesJohn R Gilbert  Procedure(s) Performed: CYSTOSCOPY WITH INSERTION OF UROLIFT (N/A )  Patient Location: PACU  Anesthesia Type:General  Level of Consciousness: sedated  Airway & Oxygen Therapy: Patient Spontanous Breathing and Patient connected to face mask oxygen  Post-op Assessment: Report given to RN and Post -op Vital signs reviewed and stable  Post vital signs: Reviewed and stable  Last Vitals:  Vitals Value Taken Time  BP 111/68 03/16/2018  8:21 AM  Temp    Pulse 70 03/16/2018  8:23 AM  Resp 14 03/16/2018  8:23 AM  SpO2 99 % 03/16/2018  8:23 AM  Vitals shown include unvalidated device data.  Last Pain:  Vitals:   03/16/18 0613  TempSrc: Tympanic  PainSc: 0-No pain         Complications: No apparent anesthesia complications

## 2018-03-16 NOTE — Anesthesia Post-op Follow-up Note (Signed)
Anesthesia QCDR form completed.        

## 2018-03-16 NOTE — Anesthesia Preprocedure Evaluation (Signed)
Anesthesia Evaluation  Patient identified by MRN, date of birth, ID band Patient awake    Reviewed: Allergy & Precautions, H&P , NPO status , Patient's Chart, lab work & pertinent test results  History of Anesthesia Complications Negative for: history of anesthetic complications  Airway Mallampati: II  TM Distance: >3 FB Neck ROM: limited    Dental  (+) Chipped   Pulmonary neg pulmonary ROS, neg sleep apnea,           Cardiovascular Exercise Tolerance: Good hypertension, (-) angina(-) Past MI and (-) DOE      Neuro/Psych negative neurological ROS  negative psych ROS   GI/Hepatic negative GI ROS, Neg liver ROS, neg GERD  ,  Endo/Other  negative endocrine ROS  Renal/GU      Musculoskeletal   Abdominal   Peds  Hematology negative hematology ROS (+)   Anesthesia Other Findings Past Medical History: 09/21/2013: Benign essential hypertension 02/14/2010: Chronic prostatitis 09/21/2013: ED (erectile dysfunction) of organic origin 02/14/2010: Enlarged prostate with lower urinary tract symptoms (LUTS) 09/21/2013: Erectile dysfunction 09/21/2013: Hyperlipemia 09/22/2013: Hypertrophy of prostate without urinary obstruction and  other lower urinary tract symptoms (LUTS)  Past Surgical History: 2009: CERVICAL DISC SURGERY     Comment:  6&7 2007: ELBOW ARTHROSCOPY; Right 2012: HYDROCELE EXCISION 2011: INGUINAL HERNIA REPAIR     Reproductive/Obstetrics negative OB ROS                             Anesthesia Physical Anesthesia Plan  ASA: III  Anesthesia Plan: General LMA   Post-op Pain Management:    Induction: Intravenous  PONV Risk Score and Plan: Ondansetron, Dexamethasone, Midazolam and Treatment may vary due to age or medical condition  Airway Management Planned: LMA  Additional Equipment:   Intra-op Plan:   Post-operative Plan: Extubation in OR  Informed Consent: I have  reviewed the patients History and Physical, chart, labs and discussed the procedure including the risks, benefits and alternatives for the proposed anesthesia with the patient or authorized representative who has indicated his/her understanding and acceptance.   Dental Advisory Given  Plan Discussed with: Anesthesiologist, CRNA and Surgeon  Anesthesia Plan Comments: (Patient consented for risks of anesthesia including but not limited to:  - adverse reactions to medications - damage to teeth, lips or other oral mucosa - sore throat or hoarseness - Damage to heart, brain, lungs or loss of life  Patient voiced understanding.)        Anesthesia Quick Evaluation

## 2018-04-15 ENCOUNTER — Ambulatory Visit (INDEPENDENT_AMBULATORY_CARE_PROVIDER_SITE_OTHER): Payer: Medicare Other | Admitting: Urology

## 2018-04-15 ENCOUNTER — Encounter: Payer: Self-pay | Admitting: Urology

## 2018-04-15 VITALS — BP 151/89 | HR 65 | Ht 74.0 in | Wt 191.9 lb

## 2018-04-15 DIAGNOSIS — N401 Enlarged prostate with lower urinary tract symptoms: Secondary | ICD-10-CM

## 2018-04-15 DIAGNOSIS — N138 Other obstructive and reflux uropathy: Secondary | ICD-10-CM | POA: Diagnosis not present

## 2018-04-15 LAB — BLADDER SCAN AMB NON-IMAGING

## 2018-04-15 NOTE — Progress Notes (Signed)
04/15/2018 10:17 AM   Gregory DalesJohn R Gilbert [birth year] [id number]  Referring provider: Kandyce RudBabaoff, Marcus, MD [phone] [address] [facility] [place] [facility] [place], [place] [id number]  Chief Complaint  Patient presents with  . Routine Post Op    HPI: 67 year old male presents for postop follow-up.  He has a long history of lower urinary tract symptoms and is status post UroLift on 03/16/2018 with placement of 6 implants.  He had difficulty voiding the night of surgery with resolution after passing a blood clot.  He had no significant dysuria.  He states he is voiding with an excellent stream and feels he is completely emptying his bladder.  He states he has not voided this well in several decades.  Preop IPSS: 20/35    PVR: 138 mL 12/19  IPSS: 2/35      PVR: 0 mL   PMH: Past Medical History:  Diagnosis Date  . Benign essential hypertension 09/21/2013  . Chronic prostatitis 02/14/2010  . 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 Enlarged prostate with lower urinary tract symptoms (LUTS) 02/14/2010  . Erectile dysfunction 09/21/2013  . Hyperlipemia 09/21/2013  . Hypertrophy of prostate without urinary obstruction and other lower urinary tract symptoms (LUTS) 09/22/2013    Surgical History: Past Surgical History:  Procedure Laterality Date  . CERVICAL DISC SURGERY  2009   6&7  . CYSTOSCOPY WITH INSERTION OF UROLIFT N/A 03/16/2018   Procedure: CYSTOSCOPY WITH INSERTION OF UROLIFT;  Surgeon: Riki AltesStoioff, Tilden Broz C, MD;  Location: [facility] ORS;  Service: Urology;  Laterality: N/A;  . ELBOW ARTHROSCOPY Right 2007  . HYDROCELE EXCISION  2012  . INGUINAL HERNIA REPAIR  2011    Home Medications:  Allergies as of 04/15/2018      Reactions   Oxycodone    "Skin crawls"   Sulfa Antibiotics Nausea Only      Medication List       Accurate as of April 15, 2018 10:17 AM. Always use your most recent med list.        aspirin EC 81 MG tablet Take 81 mg by mouth  daily.   diclofenac sodium 1 % Gel Commonly known as:  VOLTAREN Apply 2 g topically daily as needed (elbow pain).   L-Lysine 500 MG Tabs Take 500 mg by mouth daily.   lisinopril 20 MG tablet Commonly known as:  PRINIVIL,ZESTRIL Take 1 tablet (20 mg total) by mouth daily.   naproxen sodium 220 MG tablet Commonly known as:  ALEVE Take 440 mg by mouth 2 (two) times daily.   Saw Palmetto 450 MG Caps Take 450 mg by mouth daily.   simvastatin 20 MG tablet Commonly known as:  ZOCOR Take 20 mg by mouth daily.   tadalafil 5 MG tablet Commonly known as:  CIALIS Take 1 tablet (5 mg total) by mouth daily.       Allergies:  Allergies  Allergen Reactions  . Oxycodone     "Skin crawls"  . Sulfa Antibiotics Nausea Only    Family History: Family History  Problem Relation Age of Onset  . Bladder Cancer Neg Hx   . Kidney cancer Neg Hx   . Prostate cancer Neg Hx     Social History:  reports that he has never smoked. He has never used smokeless tobacco. He reports current alcohol use. He reports that he does not use drugs.  ROS: UROLOGY Frequent Urination?: No Hard to postpone urination?: No Burning/pain with urination?: No Get up  at night to urinate?: No Leakage of urine?: No Urine stream starts and stops?: No Trouble starting stream?: No Do you have to strain to urinate?: No Blood in urine?: No Urinary tract infection?: No Sexually transmitted disease?: No Injury to kidneys or bladder?: No Painful intercourse?: No Weak stream?: No Erection problems?: No Penile pain?: No  Gastrointestinal Nausea?: No Vomiting?: No Indigestion/heartburn?: No Diarrhea?: No Constipation?: No  Constitutional Fever: No Night sweats?: No Weight loss?: No Fatigue?: No  Skin Skin rash/lesions?: No Itching?: No  Eyes Blurred vision?: No Double vision?: No  Ears/Nose/Throat Sore throat?: No Sinus problems?: No  Hematologic/Lymphatic Swollen glands?: No Easy bruising?:  No  Cardiovascular Leg swelling?: No Chest pain?: No  Respiratory Cough?: No Shortness of breath?: No  Endocrine Excessive thirst?: No  Musculoskeletal Back pain?: No Joint pain?: No  Neurological Headaches?: No Dizziness?: No  Psychologic Depression?: No Anxiety?: No  Physical Exam: BP (!) 151/89 (BP Location: Left Arm, Patient Position: Sitting, Cuff Size: Normal)   Pulse 65   Ht 6\' 2"  (1.88 m)   Wt 191 lb 14.4 oz (87 kg)   BMI 24.64 kg/m   Constitutional:  Alert and oriented, No acute distress.   Assessment & Plan:   Doing well status post UroLift.  Follow-up 6 months.   Riki AltesScott C Alesa Echevarria, MD  [facility] [address], [address] [place], [place] [address] [phone]

## 2018-10-18 ENCOUNTER — Other Ambulatory Visit: Payer: Self-pay

## 2018-10-18 ENCOUNTER — Telehealth (INDEPENDENT_AMBULATORY_CARE_PROVIDER_SITE_OTHER): Payer: Medicare Other | Admitting: Urology

## 2018-10-18 DIAGNOSIS — N401 Enlarged prostate with lower urinary tract symptoms: Secondary | ICD-10-CM | POA: Diagnosis not present

## 2018-10-18 DIAGNOSIS — R531 Weakness: Secondary | ICD-10-CM

## 2018-10-18 NOTE — Progress Notes (Signed)
Virtual Visit via Telephone Note  I connected with Gregory Gilbert on 10/18/18 at  1:00 PM EDT by telephone and verified that I am speaking with the correct person using two identifiers.  Location: Patient: Home Provider: Office   I discussed the limitations, risks, security and privacy concerns of performing an evaluation and management service by telephone and the availability of in person appointments. I also discussed with the patient that there may be a patient responsible charge related to this service. The patient expressed understanding and agreed to proceed.   History of Present Illness: 68 year old male presents for six-month follow-up of BPH.  He underwent UroLift placement 03/16/2018 and had marked improvement in his lower urinary tract symptoms with change in his IPSS from 20/35 preoperatively to 2/35.  He states since his last visit his voiding symptoms have worsened from his 30-day postop results but still better than preop.  He is taking daily tadalafil which he feels also benefits his voiding pattern.   Observations/Objective: N/A  Assessment and Plan: 68 year old male with BPH status post UroLift.  His symptoms have worsened slightly since immediate postop but still better than preop.  He will continue the tadalafil.  He also inquired about a testosterone level.  He does not have significant tiredness, fatigue or decreased libido however states someone told him to have his level checked because his strength is decreased.  Follow Up Instructions: Follow-up 6 months for IPSS, PSA and testosterone   I discussed the assessment and treatment plan with the patient. The patient was provided an opportunity to ask questions and all were answered. The patient agreed with the plan and demonstrated an understanding of the instructions.   The patient was advised to call back or seek an in-person evaluation if the symptoms worsen or if the condition fails to improve as  anticipated.  I provided 6 minutes of non-face-to-face time during this encounter.   Abbie Sons, MD

## 2018-10-20 ENCOUNTER — Ambulatory Visit: Payer: Medicare Other | Admitting: Urology

## 2019-03-29 ENCOUNTER — Other Ambulatory Visit: Payer: Medicare Other

## 2019-03-29 ENCOUNTER — Other Ambulatory Visit: Payer: Self-pay

## 2019-03-29 DIAGNOSIS — R531 Weakness: Secondary | ICD-10-CM

## 2019-03-29 DIAGNOSIS — N401 Enlarged prostate with lower urinary tract symptoms: Secondary | ICD-10-CM

## 2019-03-30 LAB — TESTOSTERONE: Testosterone: 316 ng/dL (ref 264–916)

## 2019-03-30 LAB — PSA: Prostate Specific Ag, Serum: 1.8 ng/mL (ref 0.0–4.0)

## 2019-04-05 ENCOUNTER — Ambulatory Visit: Payer: Medicare Other | Admitting: Urology

## 2019-04-06 ENCOUNTER — Other Ambulatory Visit: Payer: Self-pay

## 2019-04-06 ENCOUNTER — Encounter: Payer: Self-pay | Admitting: Urology

## 2019-04-06 ENCOUNTER — Ambulatory Visit (INDEPENDENT_AMBULATORY_CARE_PROVIDER_SITE_OTHER): Payer: Medicare Other | Admitting: Urology

## 2019-04-06 VITALS — BP 162/81 | HR 83 | Ht 73.0 in | Wt 191.0 lb

## 2019-04-06 DIAGNOSIS — N401 Enlarged prostate with lower urinary tract symptoms: Secondary | ICD-10-CM | POA: Diagnosis not present

## 2019-04-06 NOTE — Progress Notes (Signed)
04/06/2019 11:46 AM   Gregory Gilbert [birth year] [id number]  Referring provider: Derinda Late, MD [phone] [facility] [place],   [address]  Chief Complaint  Patient presents with  . Benign Prostatic Hypertrophy    HPI: 68 y.o. male presents for BPH follow-up.  He underwent UroLift placement 03/16/2018 with initial marked improvement in his lower urinary tract symptoms with a change from 20/35 preoperatively to 2/35.  After 4 weeks his symptoms gradually returned.  At his visit 6 months ago he stated their worst deal better than preop however over the past several months he states he is back to his preoperative voiding symptoms.  Denies dysuria or gross hematuria.  He is currently taking daily tadalafil.  He had stopped his tamsulosin.  PSA performed 03/29/2019 was stable at 1.8.  He had also requested a testosterone level which was low normal at 316.  He denies tiredness, fatigue or decreased libido.   PMH: Past Medical History:  Diagnosis Date  . Benign essential hypertension 09/21/2013  . Chronic prostatitis 02/14/2010  . 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 Enlarged prostate with lower urinary tract symptoms (LUTS) 02/14/2010  . Erectile dysfunction 09/21/2013  . Hyperlipemia 09/21/2013  . Hypertrophy of prostate without urinary obstruction and other lower urinary tract symptoms (LUTS) 09/22/2013    Surgical History: Past Surgical History:  Procedure Laterality Date  . [place] SURGERY  2009   6&7  . CYSTOSCOPY WITH INSERTION OF UROLIFT N/A 03/16/2018   Procedure: CYSTOSCOPY WITH INSERTION OF UROLIFT;  Surgeon: Abbie Sons, MD;  Location: [facility] ORS;  Service: Urology;  Laterality: N/A;  . ELBOW ARTHROSCOPY Right 2007  . HYDROCELE EXCISION  2012  . INGUINAL HERNIA REPAIR  2011    Home Medications:  Allergies as of 04/06/2019      Reactions   Oxycodone    "Skin crawls"   Sulfa Antibiotics  Nausea Only      Medication List       Accurate as of April 06, 2019 11:46 AM. If you have any questions, ask your nurse or doctor.        STOP taking these medications   diclofenac sodium 1 % Gel Commonly known as: VOLTAREN Stopped by: Abbie Sons, MD     TAKE these medications   aspirin EC 81 MG tablet Take 81 mg by mouth daily.   L-Lysine 500 MG Tabs Take 500 mg by mouth daily.   lisinopril 20 MG tablet Commonly known as: ZESTRIL Take 1 tablet (20 mg total) by mouth daily.   meclizine 12.5 MG tablet Commonly known as: ANTIVERT Take 12.5-25 mg by mouth 3 (three) times daily as needed.   naproxen sodium 220 MG tablet Commonly known as: ALEVE Take 440 mg by mouth 2 (two) times daily.   omeprazole 20 MG capsule Commonly known as: PRILOSEC Take 20 mg by mouth daily.   Saw Palmetto 450 MG Caps Take 450 mg by mouth daily.   simvastatin 20 MG tablet Commonly known as: ZOCOR Take 20 mg by mouth daily.   tadalafil 5 MG tablet Commonly known as: CIALIS Take 1 tablet (5 mg total) by mouth daily.       Allergies:  Allergies  Allergen Reactions  . Oxycodone     "Skin crawls"  . Sulfa Antibiotics Nausea Only    Family History: Family History  Problem Relation Age of Onset  . Bladder Cancer Neg Hx   .  Kidney cancer Neg Hx   . Prostate cancer Neg Hx     Social History:  reports that he has never smoked. He has never used smokeless tobacco. He reports current alcohol use. He reports that he does not use drugs.  ROS: UROLOGY Frequent Urination?: No Hard to postpone urination?: No Burning/pain with urination?: No Get up at night to urinate?: No Leakage of urine?: No Urine stream starts and stops?: Yes Trouble starting stream?: No Do you have to strain to urinate?: No Blood in urine?: No Urinary tract infection?: No Sexually transmitted disease?: No Injury to kidneys or bladder?: No Painful intercourse?: No Weak stream?: Yes Erection  problems?: No Penile pain?: No  Gastrointestinal Nausea?: No Vomiting?: No Indigestion/heartburn?: No Diarrhea?: No Constipation?: No  Constitutional Fever: No Night sweats?: No Weight loss?: No Fatigue?: No  Skin Skin rash/lesions?: No Itching?: No  Eyes Blurred vision?: No Double vision?: No  Ears/Nose/Throat Sore throat?: No Sinus problems?: No  Hematologic/Lymphatic Swollen glands?: No Easy bruising?: No  Cardiovascular Leg swelling?: No Chest pain?: No  Respiratory Cough?: No Shortness of breath?: No  Endocrine Excessive thirst?: No  Musculoskeletal Back pain?: No Joint pain?: No  Neurological Headaches?: No Dizziness?: No  Psychologic Depression?: No Anxiety?: No  Physical Exam: BP (!) 162/81 (BP Location: Left Arm, Patient Position: Sitting, Cuff Size: Normal)   Pulse 83   Ht 6\' 1"  (1.854 m)   Wt 191 lb (86.6 kg)   BMI 25.20 kg/m   Constitutional:  Alert and oriented, No acute distress. HEENT: [place] AT, moist mucus membranes.  Trachea midline, no masses. Cardiovascular: No clubbing, cyanosis, or edema. Respiratory: Normal respiratory effort, no increased work of breathing.   Assessment & Plan:    - BPH with lower urinary tract symptoms Initial improvement in his voiding symptoms with UroLift however benefit was short-lived.  I discussed other surgical options including TURP.  I also discussed additional UroLift implants and the availability of Rezum.  He does not desire additional surgical options at this point.  He will restart tamsulosin for least 2-4 weeks and will continue if there is significant symptom improvement.  Continue annual follow-up.     , MD  [facility] [address], [address] [place], [place] [phone]

## 2019-04-07 ENCOUNTER — Encounter: Payer: Self-pay | Admitting: Urology

## 2019-04-07 MED ORDER — TADALAFIL 5 MG PO TABS: 5.0000 mg | ORAL_TABLET | Freq: Every day | ORAL | 3 refills | Status: DC

## 2019-04-07 NOTE — Addendum Note (Signed)
Addended by: Abbie Sons on: 04/07/2019 07:54 AM   Modules accepted: Orders

## 2019-04-10 ENCOUNTER — Other Ambulatory Visit: Payer: Self-pay | Admitting: Urology

## 2019-10-03 ENCOUNTER — Other Ambulatory Visit: Payer: Self-pay | Admitting: Surgery

## 2019-10-03 DIAGNOSIS — M25511 Pain in right shoulder: Secondary | ICD-10-CM

## 2019-10-03 DIAGNOSIS — M7541 Impingement syndrome of right shoulder: Secondary | ICD-10-CM

## 2019-10-17 ENCOUNTER — Ambulatory Visit
Admission: RE | Admit: 2019-10-17 | Discharge: 2019-10-17 | Disposition: A | Discharge status: Home / Self Care | Payer: Medicare Other | Source: Ambulatory Visit | Attending: Surgery | Admitting: Surgery

## 2019-10-17 ENCOUNTER — Other Ambulatory Visit: Payer: Self-pay

## 2019-10-17 DIAGNOSIS — M7541 Impingement syndrome of right shoulder: Secondary | ICD-10-CM

## 2019-10-17 DIAGNOSIS — M25511 Pain in right shoulder: Secondary | ICD-10-CM | POA: Diagnosis present

## 2020-04-04 ENCOUNTER — Encounter: Payer: Self-pay | Admitting: Urology

## 2020-04-04 ENCOUNTER — Other Ambulatory Visit: Payer: Self-pay

## 2020-04-04 ENCOUNTER — Ambulatory Visit (INDEPENDENT_AMBULATORY_CARE_PROVIDER_SITE_OTHER): Payer: Medicare Other | Admitting: Urology

## 2020-04-04 VITALS — BP 130/78 | HR 72 | Ht 73.0 in | Wt 190.0 lb

## 2020-04-04 DIAGNOSIS — N401 Enlarged prostate with lower urinary tract symptoms: Secondary | ICD-10-CM | POA: Diagnosis not present

## 2020-04-04 LAB — BLADDER SCAN AMB NON-IMAGING: Scan Result: 49

## 2020-04-04 NOTE — Progress Notes (Signed)
04/04/2020 11:16 AM   Gregory Gilbert 1951/04/20 [id number]  Referring provider: Kandyce Rud, MD [phone] [address] [place],  [place] [address]  Chief Complaint  Patient presents with  . Benign Prostatic Hypertrophy    HPI: 69 y.o. male presents for annual follow-up.   Placement UroLift 03/16/2018 after several years of medical therapy  Initial marked improvement in LUTS however symptoms gradually returned 4 weeks after the procedure  Remains on tadalafil and is currently satisfied with his voiding pattern  No dysuria or gross hematuria  Denies flank, abdominal or pelvic pain   PMH: Past Medical History:  Diagnosis Date  . Benign essential hypertension 09/21/2013  . Chronic prostatitis 02/14/2010  . 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 Enlarged prostate with lower urinary tract symptoms (LUTS) 02/14/2010  . Erectile dysfunction 09/21/2013  . Hyperlipemia 09/21/2013  . Hypertrophy of prostate without urinary obstruction and other lower urinary tract symptoms (LUTS) 09/22/2013    Surgical History: Past Surgical History:  Procedure Laterality Date  . CERVICAL DISC SURGERY  2009   6&7  . CYSTOSCOPY WITH INSERTION OF UROLIFT N/A 03/16/2018   Procedure: CYSTOSCOPY WITH INSERTION OF UROLIFT;  Surgeon: Riki Altes, MD;  Location: [facility];  Service: Urology;  Laterality: N/A;  . ELBOW ARTHROSCOPY Right 2007  . HYDROCELE EXCISION  2012  . INGUINAL HERNIA REPAIR  2011    Home Medications:  Allergies as of 04/04/2020      Reactions   Oxycodone    "Skin crawls"   Sulfa Antibiotics Nausea Only      Medication List       Accurate as of April 04, 2020 11:16 AM. If you have any questions, ask your nurse or doctor.        STOP taking these medications   meclizine 12.5 MG tablet Commonly known as: ANTIVERT Stopped by: Riki Altes, MD     TAKE these medications   aspirin EC 81 MG  tablet Take 81 mg by mouth daily.   L-Lysine 500 MG Tabs Take 500 mg by mouth daily.   lisinopril 20 MG tablet Commonly known as: ZESTRIL Take 1 tablet (20 mg total) by mouth daily.   naproxen sodium 220 MG tablet Commonly known as: ALEVE Take 440 mg by mouth 2 (two) times daily.   omeprazole 20 MG capsule Commonly known as: PRILOSEC Take 20 mg by mouth daily.   Saw Palmetto 450 MG Caps Take 450 mg by mouth daily.   simvastatin 20 MG tablet Commonly known as: ZOCOR Take 20 mg by mouth daily.   tadalafil 5 MG tablet Commonly known as: CIALIS TAKE ONE TABLET BY MOUTH DAILY       Allergies:  Allergies  Allergen Reactions  . Oxycodone     "Skin crawls"  . Sulfa Antibiotics Nausea Only    Family History: Family History  Problem Relation Age of Onset  . Bladder Cancer Neg Hx   . Kidney cancer Neg Hx   . Prostate cancer Neg Hx     Social History:  reports that he has never smoked. He has never used smokeless tobacco. He reports current alcohol use. He reports that he does not use drugs.   Physical Exam: BP 130/78   Pulse 72   Ht 6\' 1"  (1.854 m)   Wt 190 lb (86.2 kg)   BMI 25.07 kg/m   Constitutional:  Alert, No acute distress. HEENT: [place] AT, moist  mucus membranes.  Trachea midline, no masses. Cardiovascular: No clubbing, cyanosis, or edema. Respiratory: Normal respiratory effort, no increased work of breathing. Skin: No rashes, bruises or suspicious lesions. Neurologic: Grossly intact, no focal deficits, moving all 4 extremities. Psychiatric: Normal mood and affect.   Assessment & Plan:    1. Benign prostatic hyperplasia with lower urinary tract symptoms, symptom details unspecified  Bladder scan PVR 49 mL  Currently satisfied with voiding pattern  Tadalafil refilled  Continue annual follow-up  2.  Prostate cancer screening  We discussed current recommendations by the AUA for prostate cancer screening with a PSA/DRE in patients between the age  of 77-69.  Healthy patients can be extended out to age 19.  He desires to continue prostate cancer screening at least with a PSA and states he will have this drawn next week with his annual physical with Dr. Larwance Sachs.   Riki Altes, MD  [facility] [address] [place], [place] [address] [phone]

## 2020-04-05 ENCOUNTER — Encounter: Payer: Self-pay | Admitting: Urology

## 2020-04-09 LAB — URINALYSIS, COMPLETE
Bilirubin, UA: NEGATIVE
Glucose, UA: NEGATIVE
Ketones, UA: NEGATIVE
Leukocytes,UA: NEGATIVE
Nitrite, UA: NEGATIVE
Protein,UA: NEGATIVE
Specific Gravity, UA: 1.025 (ref 1.005–1.030)
Urobilinogen, Ur: 0.2 mg/dL (ref 0.2–1.0)
pH, UA: 6.5 (ref 5.0–7.5)

## 2020-04-09 LAB — MICROSCOPIC EXAMINATION
Bacteria, UA: NONE SEEN
Epithelial Cells (non renal): NONE SEEN /hpf (ref 0–10)
RBC, Urine: NONE SEEN /hpf (ref 0–2)
WBC, UA: NONE SEEN /hpf (ref 0–5)

## 2020-06-02 ENCOUNTER — Other Ambulatory Visit: Payer: Self-pay | Admitting: Urology

## 2020-06-07 ENCOUNTER — Other Ambulatory Visit: Payer: Self-pay | Admitting: Urology

## 2020-07-02 ENCOUNTER — Other Ambulatory Visit: Payer: Self-pay

## 2020-07-02 ENCOUNTER — Encounter: Payer: Self-pay | Admitting: Ophthalmology

## 2020-07-05 ENCOUNTER — Other Ambulatory Visit
Admission: RE | Admit: 2020-07-05 | Discharge: 2020-07-05 | Disposition: A | Discharge status: Home / Self Care | Payer: Medicare Other | Source: Ambulatory Visit | Attending: Ophthalmology | Admitting: Ophthalmology

## 2020-07-05 ENCOUNTER — Other Ambulatory Visit: Payer: Self-pay

## 2020-07-05 DIAGNOSIS — Z01812 Encounter for preprocedural laboratory examination: Secondary | ICD-10-CM | POA: Diagnosis present

## 2020-07-05 DIAGNOSIS — Z20822 Contact with and (suspected) exposure to covid-19: Secondary | ICD-10-CM | POA: Insufficient documentation

## 2020-07-05 LAB — SARS CORONAVIRUS 2 (TAT 6-24 HRS): SARS Coronavirus 2: NEGATIVE

## 2020-07-05 NOTE — Discharge Instructions (Signed)

## 2020-07-09 ENCOUNTER — Other Ambulatory Visit: Payer: Self-pay

## 2020-07-09 ENCOUNTER — Ambulatory Visit
Admission: RE | Admit: 2020-07-09 | Discharge: 2020-07-09 | Disposition: A | Discharge status: Home / Self Care | Payer: Medicare Other | Attending: Ophthalmology | Admitting: Ophthalmology

## 2020-07-09 ENCOUNTER — Encounter: Payer: Self-pay | Admitting: Ophthalmology

## 2020-07-09 ENCOUNTER — Encounter
Admission: RE | Disposition: A | Discharge status: Home / Self Care | Payer: Self-pay | Source: Home / Self Care | Attending: Ophthalmology

## 2020-07-09 ENCOUNTER — Ambulatory Visit: Payer: Medicare Other | Admitting: Anesthesiology

## 2020-07-09 DIAGNOSIS — Z79899 Other long term (current) drug therapy: Secondary | ICD-10-CM | POA: Diagnosis not present

## 2020-07-09 DIAGNOSIS — H2511 Age-related nuclear cataract, right eye: Secondary | ICD-10-CM | POA: Diagnosis not present

## 2020-07-09 DIAGNOSIS — Z885 Allergy status to narcotic agent status: Secondary | ICD-10-CM | POA: Diagnosis not present

## 2020-07-09 DIAGNOSIS — Z7982 Long term (current) use of aspirin: Secondary | ICD-10-CM | POA: Insufficient documentation

## 2020-07-09 DIAGNOSIS — Z791 Long term (current) use of non-steroidal anti-inflammatories (NSAID): Secondary | ICD-10-CM | POA: Insufficient documentation

## 2020-07-09 DIAGNOSIS — Z882 Allergy status to sulfonamides status: Secondary | ICD-10-CM | POA: Insufficient documentation

## 2020-07-09 HISTORY — PX: CATARACT EXTRACTION W/PHACO: SHX586

## 2020-07-09 SURGERY — PHACOEMULSIFICATION, CATARACT, WITH IOL INSERTION
Anesthesia: Monitor Anesthesia Care | Site: Eye | Laterality: Right

## 2020-07-09 MED ORDER — LIDOCAINE HCL (PF) 2 % IJ SOLN
INTRAOCULAR | Status: DC | PRN
  Administered 2020-07-09: 1 mL via INTRAOCULAR

## 2020-07-09 MED ORDER — SODIUM HYALURONATE 10 MG/ML IO SOLN
INTRAOCULAR | Status: DC | PRN
  Administered 2020-07-09: 0.55 mL via INTRAOCULAR

## 2020-07-09 MED ORDER — FENTANYL CITRATE (PF) 100 MCG/2ML IJ SOLN
INTRAMUSCULAR | Status: DC | PRN
  Administered 2020-07-09: 50 ug via INTRAVENOUS

## 2020-07-09 MED ORDER — EPINEPHRINE PF 1 MG/ML IJ SOLN
INTRAOCULAR | Status: DC | PRN
  Administered 2020-07-09: 73 mL via OPHTHALMIC

## 2020-07-09 MED ORDER — ARMC OPHTHALMIC DILATING DROPS
1.0000 "application " | OPHTHALMIC | Status: DC | PRN
  Administered 2020-07-09 (×3): 1 via OPHTHALMIC

## 2020-07-09 MED ORDER — SODIUM HYALURONATE 23 MG/ML IO SOLN
INTRAOCULAR | Status: DC | PRN
  Administered 2020-07-09: 0.6 mL via INTRAOCULAR

## 2020-07-09 MED ORDER — ACETAMINOPHEN 160 MG/5ML PO SOLN: 325.0000 mg | ORAL | Status: DC | PRN

## 2020-07-09 MED ORDER — ONDANSETRON HCL 4 MG/2ML IJ SOLN: 4.0000 mg | Freq: Once | INTRAMUSCULAR | Status: DC | PRN

## 2020-07-09 MED ORDER — ACETAMINOPHEN 325 MG PO TABS: 325.0000 mg | ORAL_TABLET | ORAL | Status: DC | PRN

## 2020-07-09 MED ORDER — MIDAZOLAM HCL 2 MG/2ML IJ SOLN
INTRAMUSCULAR | Status: DC | PRN
  Administered 2020-07-09: 1 mg via INTRAVENOUS

## 2020-07-09 MED ORDER — MOXIFLOXACIN HCL 0.5 % OP SOLN
OPHTHALMIC | Status: DC | PRN
  Administered 2020-07-09: 0.2 mL via OPHTHALMIC

## 2020-07-09 MED ORDER — TETRACAINE HCL 0.5 % OP SOLN
1.0000 [drp] | OPHTHALMIC | Status: DC | PRN
  Administered 2020-07-09 (×3): 1 [drp] via OPHTHALMIC

## 2020-07-09 SURGICAL SUPPLY — 17 items
CANNULA ANT/CHMB 27G (MISCELLANEOUS) ×2 IMPLANT
CANNULA ANT/CHMB 27GA (MISCELLANEOUS) ×4 IMPLANT
DISSECTOR HYDRO NUCLEUS 50X22 (MISCELLANEOUS) ×2 IMPLANT
GLOVE SURG SYN 8.5  E (GLOVE) ×2
GLOVE SURG SYN 8.5 E (GLOVE) ×1 IMPLANT
GLOVE SURG SYN 8.5 PF PI (GLOVE) ×1 IMPLANT
GOWN STRL REUS W/ TWL LRG LVL3 (GOWN DISPOSABLE) ×2 IMPLANT
GOWN STRL REUS W/TWL LRG LVL3 (GOWN DISPOSABLE) ×4
LENS IOL TECNIS EYHANCE 20.0 (Intraocular Lens) ×1 IMPLANT
MARKER SKIN DUAL TIP RULER LAB (MISCELLANEOUS) ×2 IMPLANT
PACK DR. KING ARMS (PACKS) ×2 IMPLANT
PACK EYE AFTER SURG (MISCELLANEOUS) ×2 IMPLANT
PACK OPTHALMIC (MISCELLANEOUS) ×2 IMPLANT
SYR 3ML LL SCALE MARK (SYRINGE) ×2 IMPLANT
SYR TB 1ML LUER SLIP (SYRINGE) ×2 IMPLANT
WATER STERILE IRR 250ML POUR (IV SOLUTION) ×2 IMPLANT
WIPE NON LINTING 3.25X3.25 (MISCELLANEOUS) ×2 IMPLANT

## 2020-07-09 NOTE — Op Note (Signed)
OPERATIVE NOTE  Gregory Gilbert [id number] 07/09/2020   PREOPERATIVE DIAGNOSIS:  Nuclear sclerotic cataract right eye.  H25.11   POSTOPERATIVE DIAGNOSIS:    Nuclear sclerotic cataract right eye.     PROCEDURE:  Phacoemusification with posterior chamber intraocular lens placement of the right eye   LENS:   Implant Name Type Inv. Item Serial No. Manufacturer Lot No. LRB No. Used Action  LENS IOL TECNIS EYHANCE 20.0 - [id number] Intraocular Lens LENS IOL TECNIS EYHANCE 20.0 [id number] JOHNSON   Right 1 Implanted       Procedure(s): CATARACT EXTRACTION PHACO AND INTRAOCULAR LENS PLACEMENT (IOC) RIGHT 3.46 00:28.6 (Right)  DIB00 +20.0   ULTRASOUND TIME: 0 minutes 28 seconds.  CDE 3.46   SURGEON:  Willey Blade, MD, MPH  ANESTHESIOLOGIST: Anesthesiologist: Jola Babinski, MD CRNA: Maree Krabbe, CRNA   ANESTHESIA:  Topical with tetracaine drops augmented with 1% preservative-free intracameral lidocaine.  ESTIMATED BLOOD LOSS: less than 1 mL.   COMPLICATIONS:  None.   DESCRIPTION OF PROCEDURE:  The patient was identified in the holding room and transported to the operating room and placed in the supine position under the operating microscope.  The right eye was identified as the operative eye and it was prepped and draped in the usual sterile ophthalmic fashion.   A 1.0 millimeter clear-corneal paracentesis was made at the 10:30 position. 0.5 ml of preservative-free 1% lidocaine with epinephrine was injected into the anterior chamber.  The anterior chamber was filled with Healon 5 viscoelastic.  A 2.4 millimeter keratome was used to make a near-clear corneal incision at the 8:00 position.  A curvilinear capsulorrhexis was made with a cystotome and capsulorrhexis forceps.  Balanced salt solution was used to hydrodissect and hydrodelineate the nucleus.   Phacoemulsification was then used in stop and chop fashion to remove the lens nucleus and epinucleus.  The remaining cortex was then  removed using the irrigation and aspiration handpiece. Healon was then placed into the capsular bag to distend it for lens placement.  A lens was then injected into the capsular bag.  The remaining viscoelastic was aspirated.   Wounds were hydrated with balanced salt solution.  The anterior chamber was inflated to a physiologic pressure with balanced salt solution.   Intracameral vigamox 0.1 mL undiluted was injected into the eye and a drop placed onto the ocular surface.  No wound leaks were noted.  The patient was taken to the recovery room in stable condition without complications of anesthesia or surgery  Willey Blade 07/09/2020, 2:01 PM

## 2020-07-09 NOTE — Anesthesia Postprocedure Evaluation (Signed)
Anesthesia Post Note  Patient: Gregory Gilbert  Procedure(s) Performed: CATARACT EXTRACTION PHACO AND INTRAOCULAR LENS PLACEMENT (IOC) RIGHT 3.46 00:28.6 (Right Eye)     Patient location during evaluation: PACU Anesthesia Type: MAC Level of consciousness: awake Pain management: pain level controlled Vital Signs Assessment: post-procedure vital signs reviewed and stable Respiratory status: respiratory function stable Cardiovascular status: stable Postop Assessment: no apparent nausea or vomiting Anesthetic complications: no   No complications documented.  Veda Canning

## 2020-07-09 NOTE — H&P (Signed)
[name]   Primary Care Physician:  Kandyce Rud, MD Ophthalmologist: Dr. Willey Blade  Pre-Procedure History & Physical: HPI:  Gregory Gilbert is a 70 y.o. male here for cataract surgery.   Past Medical History:  Diagnosis Date  . Benign essential hypertension 09/21/2013  . Chronic prostatitis 02/14/2010  . 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 Enlarged prostate with lower urinary tract symptoms (LUTS) 02/14/2010  . Erectile dysfunction 09/21/2013  . Hyperlipemia 09/21/2013  . Hypertrophy of prostate without urinary obstruction and other lower urinary tract symptoms (LUTS) 09/22/2013    Past Surgical History:  Procedure Laterality Date  . CERVICAL DISC SURGERY  2009   6&7  . CYSTOSCOPY WITH INSERTION OF UROLIFT N/A 03/16/2018   Procedure: CYSTOSCOPY WITH INSERTION OF UROLIFT;  Surgeon: Riki Altes, MD;  Location: [facility]S;  Service: Urology;  Laterality: N/A;  . ELBOW ARTHROSCOPY Right 2007  . HYDROCELE EXCISION  2012  . INGUINAL HERNIA REPAIR  2011    Prior to Admission medications   Medication Sig Start Date End Date Taking? Authorizing Provider  aspirin EC 81 MG tablet Take 81 mg by mouth daily.  03/11/12  Yes [provider]  Cholecalciferol (VITAMIN D-3 PO) Take by mouth daily.   Yes [provider]  L-Lysine 500 MG TABS Take 500 mg by mouth daily.    Yes [provider]  lisinopril (PRINIVIL,ZESTRIL) 20 MG tablet Take 1 tablet (20 mg total) by mouth daily. 09/05/10 04/04/20 Yes Bensimhon, Bevelyn Buckles, MD  naproxen sodium (ALEVE) 220 MG tablet Take 440 mg by mouth 2 (two) times daily.   Yes [provider]  psyllium (METAMUCIL) 58.6 % packet Take 1 packet by mouth daily.   Yes [provider]  simvastatin (ZOCOR) 20 MG tablet Take 20 mg by mouth daily.  02/10/17  Yes [provider]  tadalafil (CIALIS) 5 MG tablet TAKE ONE TABLET BY MOUTH DAILY 06/07/20  Yes Stoioff, Verna Czech, MD     Allergies as of 06/14/2020 - Review Complete 04/05/2020  Allergen Reaction Noted  . Oxycodone  03/04/2018  . Sulfa antibiotics Nausea Only     Family History  Problem Relation Age of Onset  . Bladder Cancer Neg Hx   . Kidney cancer Neg Hx   . Prostate cancer Neg Hx     Social History   Socioeconomic History  . Marital status: Married    Spouse name: Not on file  . Number of children: Not on file  . Years of education: Not on file  . Highest education level: Not on file  Occupational History  . Not on file  Tobacco Use  . Smoking status: Never Smoker  . Smokeless tobacco: Never Used  Vaping Use  . Vaping Use: Never used  Substance and Sexual Activity  . Alcohol use: Yes    Alcohol/week: 10.0 standard drinks    Types: 10 Glasses of wine per week    Comment: nightly red wine  . Drug use: No  . Sexual activity: Yes    Birth control/protection: None  Other Topics Concern  . Not on file  Social History Narrative  . Not on file   Social Determinants of Health   Financial Resource Strain: Not on file  Food Insecurity: Not on file  Transportation Needs: Not on file  Physical Activity: Not on file  Stress: Not on file  Social Connections: Not on file  Intimate Partner Violence: Not on file  Review of Systems: See HPI, otherwise negative ROS  Physical Exam: BP (!) 159/91   Pulse 68   Temp 97.7 F (36.5 C) (Temporal)   Resp 16   Ht 6\' 1"  (1.854 m)   Wt 88.9 kg   SpO2 100%   BMI 25.86 kg/m  General:   Alert,  pleasant and cooperative in NAD Head:  Normocephalic and atraumatic. Respiratory:  Normal work of breathing.  Impression/Plan: is here for cataract surgery.  Risks, benefits, limitations, and alternatives regarding cataract surgery have been reviewed with the patient.  Questions have been answered.  All parties agreeable.   Clide Dales, MD  07/09/2020, 1:35 PM

## 2020-07-09 NOTE — Transfer of Care (Signed)
Immediate Anesthesia Transfer of Care Note  Patient: Gregory Gilbert  Procedure(s) Performed: CATARACT EXTRACTION PHACO AND INTRAOCULAR LENS PLACEMENT (IOC) RIGHT 3.46 00:28.6 (Right Eye)  Patient Location: PACU  Anesthesia Type: MAC  Level of Consciousness: awake, alert  and patient cooperative  Airway and Oxygen Therapy: Patient Spontanous Breathing and Patient connected to supplemental oxygen  Post-op Assessment: Post-op Vital signs reviewed, Patient's Cardiovascular Status Stable, Respiratory Function Stable, Patent Airway and No signs of Nausea or vomiting  Post-op Vital Signs: Reviewed and stable  Complications: No complications documented.

## 2020-07-09 NOTE — Anesthesia Preprocedure Evaluation (Signed)
Anesthesia Evaluation  Patient identified by MRN, date of birth, ID band Patient awake    Reviewed: Allergy & Precautions, NPO status   Airway Mallampati: II  TM Distance: >3 FB     Dental   Pulmonary    breath sounds clear to auscultation       Cardiovascular hypertension,  Rhythm:Regular Rate:Normal  HLD   Neuro/Psych    GI/Hepatic   Endo/Other    Renal/GU      Musculoskeletal   Abdominal   Peds  Hematology   Anesthesia Other Findings Chronic prostatitis  Reproductive/Obstetrics                             Anesthesia Physical Anesthesia Plan  ASA: II  Anesthesia Plan: MAC   Post-op Pain Management:    Induction: Intravenous  PONV Risk Score and Plan: TIVA, Midazolam and Treatment may vary due to age or medical condition  Airway Management Planned: Natural Airway and Nasal Cannula  Additional Equipment:   Intra-op Plan:   Post-operative Plan:   Informed Consent: I have reviewed the patients History and Physical, chart, labs and discussed the procedure including the risks, benefits and alternatives for the proposed anesthesia with the patient or authorized representative who has indicated his/her understanding and acceptance.       Plan Discussed with: CRNA  Anesthesia Plan Comments:         Anesthesia Quick Evaluation

## 2020-07-11 ENCOUNTER — Encounter: Payer: Self-pay | Admitting: Ophthalmology

## 2020-11-26 DEATH — deceased

## 2021-01-23 ENCOUNTER — Other Ambulatory Visit: Payer: Self-pay | Admitting: Neurosurgery

## 2021-01-23 DIAGNOSIS — G8929 Other chronic pain: Secondary | ICD-10-CM

## 2021-02-04 ENCOUNTER — Ambulatory Visit
Admission: RE | Admit: 2021-02-04 | Discharge: 2021-02-04 | Disposition: A | Discharge status: Home / Self Care | Payer: Medicare Other | Source: Ambulatory Visit | Attending: Neurosurgery | Admitting: Neurosurgery

## 2021-02-04 ENCOUNTER — Other Ambulatory Visit: Payer: Self-pay

## 2021-02-04 DIAGNOSIS — M5441 Lumbago with sciatica, right side: Secondary | ICD-10-CM | POA: Insufficient documentation

## 2021-02-04 DIAGNOSIS — G8929 Other chronic pain: Secondary | ICD-10-CM

## 2021-02-08 ENCOUNTER — Other Ambulatory Visit: Payer: Self-pay | Admitting: Neurosurgery

## 2021-04-04 ENCOUNTER — Encounter: Payer: Self-pay | Admitting: Urology

## 2021-04-04 ENCOUNTER — Other Ambulatory Visit: Payer: Self-pay

## 2021-04-04 ENCOUNTER — Ambulatory Visit (INDEPENDENT_AMBULATORY_CARE_PROVIDER_SITE_OTHER): Payer: Medicare Other | Admitting: Urology

## 2021-04-04 VITALS — BP 150/88 | HR 66 | Ht 73.0 in | Wt 190.0 lb

## 2021-04-04 DIAGNOSIS — N401 Enlarged prostate with lower urinary tract symptoms: Secondary | ICD-10-CM | POA: Diagnosis not present

## 2021-04-04 DIAGNOSIS — N529 Male erectile dysfunction, unspecified: Secondary | ICD-10-CM | POA: Diagnosis not present

## 2021-04-04 LAB — URINALYSIS, COMPLETE
Bilirubin, UA: NEGATIVE
Glucose, UA: NEGATIVE
Leukocytes,UA: NEGATIVE
Nitrite, UA: NEGATIVE
Protein,UA: NEGATIVE
RBC, UA: NEGATIVE
Specific Gravity, UA: 1.025 (ref 1.005–1.030)
Urobilinogen, Ur: 0.2 mg/dL (ref 0.2–1.0)
pH, UA: 6 (ref 5.0–7.5)

## 2021-04-04 LAB — MICROSCOPIC EXAMINATION
Bacteria, UA: NONE SEEN
Epithelial Cells (non renal): NONE SEEN /hpf (ref 0–10)
RBC, Urine: NONE SEEN /hpf (ref 0–2)

## 2021-04-04 LAB — BLADDER SCAN AMB NON-IMAGING: Scan Result: 0

## 2021-04-04 MED ORDER — TADALAFIL 5 MG PO TABS: 5.0000 mg | ORAL_TABLET | Freq: Every day | ORAL | 2 refills | Status: DC

## 2021-04-04 NOTE — Progress Notes (Signed)
04/04/2021 5:16 PM   Gregory Gilbert 1950-11-17 [id number]  Referring provider: Derinda Late, MD [phone] [address] [place],  [place] [address]  Chief Complaint  Patient presents with   Benign Prostatic Hypertrophy    Urologic history:  1.  BPH with LUTS Medical therapy time several years UroLift 02/2018 with initial marked improvement in his voiding symptoms however symptoms gradually return 4 weeks after the procedure Tadalafil 5 mg daily  2.  Erectile dysfunction Tadalafil 5 mg daily  3.  History hydrocelectomy 2012   HPI: 70 y.o. male presents for annual follow-up.  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   Enlarged prostate with lower urinary tract symptoms (LUTS) 02/14/2010   Erectile dysfunction 09/21/2013   Hyperlipemia 09/21/2013   Hypertrophy of prostate without urinary obstruction and other lower urinary tract symptoms (LUTS) 09/22/2013    Surgical History: Past Surgical History:  Procedure Laterality Date   CATARACT EXTRACTION W/PHACO Right 07/09/2020   Procedure: CATARACT EXTRACTION PHACO AND INTRAOCULAR LENS PLACEMENT (IOC) RIGHT 3.46 00:28.6;  Surgeon: Eulogio Bear, MD;  Location: [facility];  Service: Ophthalmology;  Laterality: Right;   CERVICAL DISC SURGERY  2009   6&7   CYSTOSCOPY WITH INSERTION OF UROLIFT N/A 03/16/2018   Procedure: CYSTOSCOPY WITH INSERTION OF UROLIFT;  Surgeon: Abbie Sons, MD;  Location: [facility];  Service: Urology;  Laterality: N/A;   ELBOW ARTHROSCOPY Right 2007   HYDROCELE EXCISION  2012   INGUINAL HERNIA REPAIR  2011    Home Medications:  Allergies as of 04/04/2021       Reactions   Oxycodone    "Skin crawls"   Sulfa Antibiotics Nausea Only        Medication List        Accurate as of April 04, 2021  5:16 PM. If you have any questions, ask your nurse or doctor.          aspirin EC 81 MG tablet Take 81 mg by mouth daily.   L-Lysine 500 MG Tabs Take 500 mg by mouth daily.   lisinopril 20 MG tablet Commonly known as: ZESTRIL Take 1 tablet (20 mg total) by mouth daily.   naproxen sodium 220 MG tablet Commonly known as: ALEVE Take 440 mg by mouth 2 (two) times daily.   psyllium 58.6 % packet Commonly known as: METAMUCIL Take 1 packet by mouth daily.   simvastatin 20 MG tablet Commonly known as: ZOCOR Take 20 mg by mouth daily.   tadalafil 5 MG tablet Commonly known as: CIALIS Take 1 tablet (5 mg total) by mouth daily.   VITAMIN D-3 PO Take by mouth daily.        Allergies:  Allergies  Allergen Reactions   Oxycodone     "Skin crawls"   Sulfa Antibiotics Nausea Only    Family History: Family History  Problem Relation Age of Onset   Bladder Cancer Neg Hx    Kidney cancer Neg  Hx    Prostate cancer Neg Hx     Social History:  reports that he has never smoked. He has never used smokeless tobacco. He reports current alcohol use of about 10.0 standard drinks per week. He reports that he does not use drugs.   Physical Exam: BP (!) 150/88   Pulse 66   Ht 6\' 1"  (1.854 m)   Wt 190 lb (86.2 kg)   BMI 25.07 kg/m   Constitutional:  Alert and oriented, No acute distress. HEENT: [place] AT, moist mucus membranes.  Trachea midline, no masses. Cardiovascular: No clubbing, cyanosis, or edema. Respiratory: Normal respiratory effort, no increased work of breathing. Psychiatric: Normal mood and  affect.  Laboratory Data:  Urinalysis Dipstick/microscopy negative   Assessment & Plan:    1.  BPH with LUTS Stable on tadalafil Continue annual follow-up  2.  Erectile dysfunction Stable  3.  Prostate cancer screening Desires to continue annual PSA which was ordered   , MD  [facility] [address], [address] [place], [place] [phone]

## 2021-04-05 ENCOUNTER — Encounter: Payer: Self-pay | Admitting: *Deleted

## 2021-04-05 LAB — PSA: Prostate Specific Ag, Serum: 1.7 ng/mL (ref 0.0–4.0)

## 2021-04-07 ENCOUNTER — Encounter: Payer: Self-pay | Admitting: Urology

## 2021-04-11 ENCOUNTER — Inpatient Hospital Stay: Admit: 2021-04-11 | Payer: Medicare Other | Admitting: Neurosurgery

## 2021-04-11 SURGERY — POSTERIOR LUMBAR FUSION 1 LEVEL
Anesthesia: General

## 2021-05-13 ENCOUNTER — Other Ambulatory Visit: Payer: Self-pay | Admitting: Family Medicine

## 2021-05-13 MED ORDER — TADALAFIL 5 MG PO TABS: 5.0000 mg | ORAL_TABLET | Freq: Every day | ORAL | 2 refills | Status: DC

## 2022-04-04 ENCOUNTER — Encounter: Payer: Self-pay | Admitting: Urology

## 2022-04-04 ENCOUNTER — Ambulatory Visit (INDEPENDENT_AMBULATORY_CARE_PROVIDER_SITE_OTHER): Payer: Medicare Other | Admitting: Urology

## 2022-04-04 VITALS — BP 134/75 | HR 65 | Ht 74.0 in | Wt 190.0 lb

## 2022-04-04 DIAGNOSIS — N529 Male erectile dysfunction, unspecified: Secondary | ICD-10-CM | POA: Diagnosis not present

## 2022-04-04 DIAGNOSIS — N401 Enlarged prostate with lower urinary tract symptoms: Secondary | ICD-10-CM | POA: Diagnosis not present

## 2022-04-04 LAB — MICROSCOPIC EXAMINATION: Bacteria, UA: NONE SEEN

## 2022-04-04 LAB — URINALYSIS, COMPLETE
Bilirubin, UA: NEGATIVE
Glucose, UA: NEGATIVE
Ketones, UA: NEGATIVE
Leukocytes,UA: NEGATIVE
Nitrite, UA: NEGATIVE
Protein,UA: NEGATIVE
RBC, UA: NEGATIVE
Specific Gravity, UA: 1.015 (ref 1.005–1.030)
Urobilinogen, Ur: 1 mg/dL (ref 0.2–1.0)
pH, UA: 6.5 (ref 5.0–7.5)

## 2022-04-04 LAB — BLADDER SCAN AMB NON-IMAGING: Scan Result: 7

## 2022-04-04 MED ORDER — TADALAFIL 5 MG PO TABS: 5.0000 mg | ORAL_TABLET | Freq: Every day | ORAL | 3 refills | Status: DC

## 2022-04-04 NOTE — Progress Notes (Unsigned)
04/04/2022 12:54 PM   Gregory Gilbert 10/27/1950 [id number]  Referring provider: Kandyce Rud, MD [phone] [address] [facility] [place],  [place] [address]  Chief Complaint  Patient presents with   Benign Prostatic Hypertrophy    Urologic history:  1.  BPH with LUTS Medical therapy time several years UroLift 02/2018 with initial marked improvement in his voiding symptoms however symptoms gradually return 4 weeks after the procedure Tadalafil 5 mg daily  2.  Erectile dysfunction Tadalafil 5 mg daily  3.  History hydrocelectomy 2012   HPI: 71 y.o. male presents for annual follow-up.  Doing well since last visit Stable urinary stream which is weak. Denies dysuria, gross hematuria Denies flank, abdominal or pelvic pain Continues on tadalafil 5 mg daily which appears to help his nocturia.  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;  Service: Ophthalmology;  Laterality: Right;   CERVICAL DISC SURGERY  2009   6&7   CYSTOSCOPY WITH INSERTION OF UROLIFT N/A 03/16/2018   Procedure: CYSTOSCOPY WITH INSERTION OF UROLIFT;  Surgeon: Riki Altes, MD;  Location: [facility];  Service: Urology;  Laterality: N/A;   ELBOW ARTHROSCOPY Right 2007   HYDROCELE EXCISION  2012   INGUINAL HERNIA REPAIR  2011    Home Medications:  Allergies as of 04/04/2022       Reactions   Oxycodone    "Skin crawls"   Sulfa Antibiotics Nausea Only        Medication List        Accurate as of April 04, 2022 12:54 PM. If you have any questions, ask your nurse or doctor.          aspirin EC 81 MG tablet Take 81 mg by mouth daily.   L-Lysine 500 MG Tabs Take 500 mg by mouth daily.   lisinopril 20 MG tablet Commonly known as: ZESTRIL Take 1 tablet (20 mg total) by mouth daily.   naproxen sodium 220 MG tablet Commonly known as: ALEVE Take 440 mg by mouth 2 (two) times daily.   psyllium 58.6 % packet Commonly known as: METAMUCIL Take 1 packet by mouth daily.   simvastatin 20 MG tablet Commonly known as: ZOCOR Take 20 mg by mouth daily.   tadalafil 5 MG tablet Commonly known as: CIALIS Take 1 tablet (5 mg total) by mouth daily.   VITAMIN D-3 PO Take by mouth daily.        Allergies:  Allergies  Allergen Reactions   Oxycodone     "Skin crawls"   Sulfa Antibiotics Nausea Only    Family History: Family History  Problem Relation Age of Onset   Bladder Cancer Neg Hx    Kidney cancer Neg Hx  Prostate cancer Neg Hx     Social History:  reports that he has never smoked. He has never used smokeless tobacco. He reports current alcohol use of about 10.0 standard drinks of alcohol per week. He reports that he does not use drugs.   Physical Exam: BP 134/75   Pulse 65   Ht 6\' 2"  (1.88 m)   Wt 190 lb (86.2 kg)   BMI 24.39 kg/m   Constitutional:  Alert and oriented, No acute distress. HEENT: [place] AT, moist mucus membranes.  Trachea midline, no masses. Cardiovascular: No clubbing, cyanosis, or edema. Respiratory: Normal respiratory effort, no increased work of breathing. Psychiatric: Normal mood and affect.  Laboratory  Data:  Urinalysis Dipstick/microscopy negative   Assessment & Plan:    1.  BPH with LUTS Stable on tadalafil Bothersome urinary hesitancy, weak stream though he does not desire any additional surgical options at this time Continue annual follow-up  2.  Erectile dysfunction Stable  3.  Prostate cancer screening PSA stable   , MD  [facility] [address] [place], [place] [phone]

## 2022-05-01 ENCOUNTER — Other Ambulatory Visit: Payer: Self-pay | Admitting: Urology

## 2022-05-07 ENCOUNTER — Other Ambulatory Visit: Payer: Self-pay | Admitting: Family Medicine

## 2022-05-07 MED ORDER — TADALAFIL 5 MG PO TABS: 5.0000 mg | ORAL_TABLET | Freq: Every day | ORAL | 3 refills | Status: DC

## 2023-03-09 ENCOUNTER — Other Ambulatory Visit: Payer: Self-pay | Admitting: Neurosurgery

## 2023-03-09 DIAGNOSIS — M48062 Spinal stenosis, lumbar region with neurogenic claudication: Secondary | ICD-10-CM

## 2023-03-09 DIAGNOSIS — R292 Abnormal reflex: Secondary | ICD-10-CM

## 2023-03-12 ENCOUNTER — Ambulatory Visit
Admission: RE | Admit: 2023-03-12 | Discharge: 2023-03-12 | Disposition: A | Discharge status: Home / Self Care | Payer: Medicare Other | Source: Ambulatory Visit | Attending: Neurosurgery | Admitting: Neurosurgery

## 2023-03-12 DIAGNOSIS — M48062 Spinal stenosis, lumbar region with neurogenic claudication: Secondary | ICD-10-CM | POA: Insufficient documentation

## 2023-03-12 DIAGNOSIS — R292 Abnormal reflex: Secondary | ICD-10-CM

## 2023-04-06 ENCOUNTER — Ambulatory Visit: Payer: Medicare Other | Admitting: Urology

## 2023-04-06 ENCOUNTER — Encounter: Payer: Self-pay | Admitting: Urology

## 2023-04-06 VITALS — BP 130/84 | HR 78 | Ht 70.0 in | Wt 194.0 lb

## 2023-04-06 DIAGNOSIS — N401 Enlarged prostate with lower urinary tract symptoms: Secondary | ICD-10-CM | POA: Diagnosis not present

## 2023-04-06 DIAGNOSIS — N529 Male erectile dysfunction, unspecified: Secondary | ICD-10-CM | POA: Diagnosis not present

## 2023-04-06 LAB — BLADDER SCAN AMB NON-IMAGING: Scan Result: 64

## 2023-04-06 MED ORDER — TADALAFIL 20 MG PO TABS: ORAL_TABLET | ORAL | 3 refills | Status: DC

## 2023-04-06 MED ORDER — TADALAFIL 5 MG PO TABS: 5.0000 mg | ORAL_TABLET | Freq: Every day | ORAL | 3 refills | Status: DC

## 2023-04-06 NOTE — Progress Notes (Signed)
I, Maysun Anabel Bene, acting as a scribe for Riki Altes, MD., have documented all relevant documentation on the behalf of Riki Altes, MD, as directed by Riki Altes, MD while in the presence of Riki Altes, MD.  04/06/2023 4:18 PM   Clide Dales April 26, 1951 [id number]  Referring provider: Kandyce Rud, MD [id number] [facility] [place],  [place] [address]  Chief Complaint  Patient presents with   Benign Prostatic Hypertrophy   Urologic history: 1.  BPH with LUTS Medical therapy time several years UroLift 02/2018 with initial marked improvement in his voiding symptoms however symptoms gradually return 4 weeks after the procedure Tadalafil 5 mg daily   2.  Erectile dysfunction Tadalafil 5 mg daily   3.  History hydrocelectomy 2012  HPI: Gregory Gilbert is a 72 y.o. male presents for annual follow-up  Since last year's visit, he has stable lower urinary tract symptoms.  Denies dysuria, gross hematuria Denies flank, abdominal or pelvic pain Continues the tadalafil 5 mg daily, which helps his nocturia.  He takes additional tadalafil as needed for intercourse, though still with ED.  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   Enlarged prostate with lower urinary tract symptoms (LUTS) 02/14/2010   Erectile dysfunction 09/21/2013   Hyperlipemia 09/21/2013   Hypertrophy of prostate without urinary obstruction and other lower urinary tract symptoms (LUTS) 09/22/2013    Surgical History: Past Surgical History:  Procedure Laterality Date   CATARACT EXTRACTION W/PHACO Right 07/09/2020   Procedure: CATARACT EXTRACTION PHACO AND INTRAOCULAR LENS PLACEMENT (IOC) RIGHT 3.46 00:28.6;  Surgeon: Nevada Crane, MD;  Location: [facility];  Service:  Ophthalmology;  Laterality: Right;   CERVICAL DISC SURGERY  2009   6&7   CYSTOSCOPY WITH INSERTION OF UROLIFT N/A 03/16/2018   Procedure: CYSTOSCOPY WITH INSERTION OF UROLIFT;  Surgeon: Riki Altes, MD;  Location: [facility];  Service: Urology;  Laterality: N/A;   ELBOW ARTHROSCOPY Right 2007   HYDROCELE EXCISION  2012   INGUINAL HERNIA REPAIR  2011    Home Medications:  Allergies as of 04/06/2023       Reactions   Oxycodone    "Skin crawls"   Sulfa Antibiotics Nausea Only        Medication List        Accurate as of April 06, 2023  4:18 PM. If you have any questions, ask your nurse or doctor.          aspirin EC 81 MG tablet Take 81 mg by mouth daily.   L-Lysine 500 MG Tabs Take 500 mg by mouth daily.   lisinopril 20 MG tablet Commonly known as: ZESTRIL Take 1 tablet (20 mg total) by mouth daily.   naproxen sodium 220 MG tablet Commonly known as: ALEVE Take 440 mg by mouth 2 (two) times daily.   psyllium 58.6 % packet Commonly known as: METAMUCIL Take 1 packet by mouth daily.   simvastatin 20 MG tablet Commonly known as: ZOCOR Take 20 mg by mouth daily.   tadalafil 5 MG tablet Commonly known as: CIALIS Take 1 tablet (5 mg total) by mouth daily. What changed: Another medication with the same name was added. Make sure you understand how and when to take each. Changed by: Verna Czech  Janvi Ammar   tadalafil 20 MG tablet Commonly known as: CIALIS 1 tab 1 hour prior to intercourse What changed: You were already taking a medication with the same name, and this prescription was added. Make sure you understand how and when to take each. Changed by: Riki Altes   VITAMIN D-3 PO Take by mouth daily.        Allergies:  Allergies  Allergen Reactions   Oxycodone     "Skin crawls"   Sulfa Antibiotics Nausea Only    Family History: Family History  Problem Relation Age of Onset   Bladder Cancer Neg Hx    Kidney cancer Neg Hx    Prostate cancer Neg  Hx     Social History:  reports that he has never smoked. He has never used smokeless tobacco. He reports current alcohol use of about 10.0 standard drinks of alcohol per week. He reports that he does not use drugs.   Physical Exam: BP 130/84   Pulse 78   Ht 5\' 10"  (1.778 m)   Wt 194 lb (88 kg)   BMI 27.84 kg/m   Constitutional:  Alert and oriented, No acute distress. HEENT: [place] AT, moist mucus membranes.  Trachea midline, no masses. Cardiovascular: No clubbing, cyanosis, or edema. Respiratory: Normal respiratory effort, no increased work of breathing. GI: Abdomen is soft, nontender, nondistended, no abdominal masses Skin: No rashes, bruises or suspicious lesions. Neurologic: Grossly intact, no focal deficits, moving all 4 extremities. Psychiatric: Normal mood and affect.   Assessment & Plan:    1. BPH with LUTS Stable on tadalafil Not interested in surgical options.  Continue annual follow-up.   2. Erectile dysfunction Not interested in intracavernosal injections. Rx 20 mg tadalafil sent to pharmacy, which he can take on demand.  [facility] [address], [address] [place], [place] [address] [phone]

## 2023-05-17 ENCOUNTER — Other Ambulatory Visit: Payer: Self-pay | Admitting: Urology

## 2023-05-19 ENCOUNTER — Other Ambulatory Visit: Payer: Self-pay | Admitting: *Deleted

## 2023-05-19 MED ORDER — TADALAFIL 5 MG PO TABS: 5.0000 mg | ORAL_TABLET | Freq: Every day | ORAL | 3 refills | Status: DC

## 2023-09-28 ENCOUNTER — Other Ambulatory Visit: Payer: Self-pay | Admitting: Neurology

## 2023-09-28 DIAGNOSIS — G20A1 Parkinson's disease without dyskinesia, without mention of fluctuations: Secondary | ICD-10-CM

## 2023-09-29 ENCOUNTER — Ambulatory Visit
Admission: RE | Admit: 2023-09-29 | Discharge: 2023-09-29 | Disposition: A | Discharge status: Home / Self Care | Source: Ambulatory Visit | Attending: Neurology | Admitting: Neurology

## 2023-09-29 DIAGNOSIS — G20A1 Parkinson's disease without dyskinesia, without mention of fluctuations: Secondary | ICD-10-CM | POA: Insufficient documentation

## 2024-02-28 NOTE — Progress Notes (Signed)
 [facility]  Date: 02/29/2024 Patient Name: Gregory Gilbert MRN: [id number] PCP: Diedra Jerona Ruts Referring Provider: Diedra Jerona Ruts,*  HISTORY   Gregory Gilbert is a 73 y.o. right handed male who is seen today in consultation at the request of Doctor [facility] for evaluation of Parkinson's disease. Gregory Gilbert is accompanied by his wife. History is obtained from the patient, family members, and review of prior records.  Gregory Gilbert is a 73 year old right-handed man with history of hypertension, hyperlipidemia, BPH Parkinson's disease referred for further evaluation of Parkinson's disease.  Onset of symptoms this was approximately 2023 with right hand tremor first noticed by the patient when he was eating using a fork or spoon his hand would shake a little bit head wanting food to his mouth.  His tremor remained that way for approximately 1 year then he started to notice a constant tremor in his right hand at rest when he was not using his hand.  His wife mentions she has noticed some change in his gait with stooped posture, and mild shuffling.  Patient has noticed stiffness in his hands, he has no history of arthritis in his hands.  He reports that he has noticed changes in his handwriting and stop writing cursive.  His handwriting has become smaller.  On occasion he has had trouble swallowing steak and will get food caught in the back of his throat.  He has had changes in his sense of smell dating back at least 10 years but more noticeable recently.  Now he can no longer smell anything, he can still taste salty and sweet.   He saw Dr. Jannett Fairly 09/23/2023 who diagnosed him with Parkinson's disease and started him on Sinemet IR 1 tab 3 times daily.  He tolerates Sinemet however not noticed any improvement in tremor with Sinemet IR. He was last seen in neurology clinic 10/1 and switched to Sinemet CR 50/200 3 times daily.  Sinemet CR is helpful for his tremor,  he feels that medication kicks in after 15 minutes typically lasts 3 hours then his tremor returns.  He does not notice his tremor when he is active throughout the day but when he is resting he will noticed a very slight tremor in his thumb and index finger which is annoying.  His tremor does not interfere with ADLs.  He is taking Sinemet CR at 5 AM, 2 PM and 9 PM.  Him and his wife endorse recent development of treatment enactment.  He will yell and kick in his sleep.  He will have vivid dreams and wake himself up with dream enactment.  A few times he thought he was kicking a snake.  He complains of restless sensation in bilateral lower extremities at nighttime, he has a sensation that he needs to move his legs.  He has never been diagnosed with RLS before.  PD History Diagnosed with PD: 2025 Initial symptoms and laterality: RUE tremor  Progression of the disease/new sx's: NA Trial of levodopa: yes, responded to Sinemet CR  Exposure to dopamine antagonists, insecticides, agent orange, manganese exposure: no Family history of PD or any other Parkinson disease: aunt on mother side with PD  Motor Symptoms Wearing-off: Wearing off about 3 hours Tremor: RUE tremor Dyskinesias: no Dystonia:  no Freezing: No Myoclonus: no Postural instability/falls: no    Non-Motor Symptoms Dysphagia: sometimes food will get come back of his throat Speech difficulty: softer  Orthostasis: no Depression/Anxiety: no Memory loss: no issues  Constipation:  no Urinary retention, urgency or incontinence: no issues , urolift 5 years  RBD: yelling and kicking out in his sleep. Sleeping in chair since shoulder surgery. Swatted at wife 1 night. 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  Functional with all ADLs Textile exporter business No drugs, etoh, tobacco use He has 2 adult children  Family history Family history of Parkinson's disease in his maternal and    03/12/2023 MRI L-Spine without  IMPRESSION:  1. At L4-5 there is a moderate disc bulge. Severe bilateral facet  arthropathy with ligamentum flavum infolding. Moderate central canal  stenosis. Moderate left foraminal stenosis. Moderate-severe right  foraminal stenosis.  2. At L2-3 there is a moderate disc bulge flattening the ventral  thecal sac. Mild bilateral facet arthropathy. Moderate central canal  stenosis.  3. At L1-2 there is a mild disc bulge. Moderate bilateral facet  arthropathy. Mild-moderate central canal stenosis. Mild right  foraminal stenosis.  4. At L5-S1 there is a mild disc osteophyte complex with a prominent  right foraminal component. Mild bilateral facet arthropathy. Mild  bilateral foraminal stenosis.  5. No acute osseous injury of the lumbar spine.    03/11/2024 MRI C-Spine without  IMPRESSION:  1. At C3-4 there is right uncovertebral degenerative changes.  Mild-moderate right foraminal stenosis. Mild left foraminal  stenosis.  2. At C4-5 there is mild-moderate right and mild left foraminal  stenosis.  3. At C5-6 there is mild bilateral facet arthropathy. Left  uncovertebral degenerative changes. Mild-moderate right foraminal  stenosis. Moderate left foraminal stenosis.  4. Anterior cervical fusion at C6-7. No foraminal or central canal  stenosis.  5. No acute osseous injury of the cervical spine.    Mild cerebral small vessel ischemic changes   09/29/2023 MRI of the brain to rule out small  stroke. IMPRESSION:  1. No evidence of an acute intracranial abnormality.  2. Mild chronic small vessel ischemic changes within the cerebral  white matter   I performed a complete ROS. Positive and pertinent negative responses are documented in the HPI, and all other systems are negative  Current Outpatient Medications  Medication Instructions   aspirin 81 mg Cap 1 capsule, Daily   atorvastatin (LIPITOR) 10 mg, Oral, Daily   carbidopa-levodopa (SINEMET CR) 50-200 mg CR tablet 1 tablet, Oral, 3 times Daily   carbidopa-levodopa (SINEMET) 25-100 mg tablet 1 tablet, Oral, 3 times Daily   cholecalciferol (CHOLECALCIFEROL) 1,000 Units, Daily   lisinopriL  (ZESTRIL ) 20 mg, Oral, Daily   meloxicam (MOBIC) 15 mg, Daily   tadalafiL  (CIALIS ) 5 mg, Daily     Past Medical History:  Diagnosis Date   Arthritis    BPH (benign prostatic hypertrophy)    with prostatism, stable   Carcinoma of the skin, basal cell    Cataract cortical, senile    Chronic low back pain    RLE radiculopathy with CT, MRI and bone scan in [place] all normal.   ED (erectile dysfunction)    GERD (gastroesophageal  reflux disease)    barium swallow and abdominal ultrasound were both normal, sx resolved on Nexium   H/O degenerative disc disease    c-spine   Hyperlipidemia    diet controlled   Hypertension    Paroxysmal hypertension 1992   negative workup for pheochromocytoma without recurrence   Perennial allergic rhinitis     Past Surgical History:  Procedure Laterality Date   C6-7 LAMINECTOMY  2010   Dr. Mavis   RIGHT INGUINAL HERNIA REPAIR  07/2009   HYDROCELE EXCISION / REPAIR  2014   COLONOSCOPY  07/28/2014   Int Hemorrhoids: [facility] 07/2024   EXTRACTION CATARACT EXTRACAPSULAR W/INSERTION INTRAOCULAR PROSTHESIS Left 12/13/2015   Procedure: L - LENSX / ORA ( $ 600 ) EXTRACTION CATARACT EXTRACAPSULAR W/INSERTION INTRAOCULAR PROSTHESIS;  Surgeon: Jerel Cramp, MD;  Location: [facility];   Service: Ophthalmology;  Laterality: Left;  LenSx/ORA   Urolift N/A 02/2018   BASAL CELL CARCINOMA REMOVAL FROM CHEST     Dr. Jakie   COLONOSCOPY     ELBOW SURGERY     LENS EYE SURGERY     SPINE SURGERY  2011   tooth implant      Social History   Socioeconomic History   Marital status: Married  Tobacco Use   Smoking status: Never   Smokeless tobacco: Never  Substance and Sexual Activity   Alcohol use: Yes    Alcohol/week: 2.0 standard drinks of alcohol    Types: 2 Glasses of wine per week    Comment: Occasional.   Drug use: Never   Sexual activity: Yes    Partners: Female    Birth control/protection: None   Social Drivers of Corporate Investment Banker Strain: Low Risk  (10/13/2023)   Overall Financial Resource Strain (CARDIA)    Difficulty of Paying Living Expenses: Not hard at all  Food Insecurity: No Food Insecurity (10/13/2023)   Hunger Vital Sign    Worried About Running Out of Food in the Last Year: Never true    Ran Out of Food in the Last Year: Never true  Transportation Needs: No Transportation Needs (10/13/2023)   PRAPARE - Administrator, Civil Service (Medical): No    Lack of Transportation (Non-Medical): No  Housing Stability: Low Risk  (10/13/2023)   Housing Stability Vital Sign    Unable to Pay for Housing in the Last Year: No    Number of Times Moved in the Last Year: 1    Homeless in the Last Year: No    Family History  Problem Relation Age of Onset   High blood pressure (Hypertension) Other        family hx   Cataracts Mother    Glaucoma Mother    Cataracts Father    Hearing loss Father    Heart disease Father    Coronary Artery Disease (Blocked arteries around heart) Neg Hx    Colon cancer Neg Hx    Prostate cancer Neg Hx     Allergies  Allergen Reactions   Percocet [email or web address].[email or web address] ] Other (See Comments)    Itchy, sensation of things crawling on skin   Oxycodone Itching    Skin  crawls     Current Outpatient Medications:    aspirin 81 mg Cap, Take 1 capsule by mouth once daily, Disp: , Rfl:    atorvastatin (LIPITOR) 10 MG tablet, Take 1 tablet (10 mg total) by mouth once daily, Disp: 90 tablet, Rfl: 1   carbidopa-levodopa (SINEMET CR) 50-200 mg CR  tablet, Take 1 tablet by mouth 3 (three) times daily, Disp: 90 tablet, Rfl: 4   carbidopa-levodopa (SINEMET) 25-100 mg tablet, Take 1 tablet by mouth 3 (three) times daily, Disp: 90 tablet, Rfl: 11   cholecalciferol 1000 unit tablet, Take 1,000 Units by mouth once daily, Disp: , Rfl:    lisinopriL  (ZESTRIL ) 20 MG tablet, TAKE ONE TABLET BY MOUTH ONE TIME DAILY, Disp: 90 tablet, Rfl: 1   meloxicam (MOBIC) 15 MG tablet, Take 15 mg by mouth once daily, Disp: , Rfl:    tadalafil  (CIALIS ) 5 MG tablet, Take 5 mg by mouth once daily., Disp: , Rfl:   PHYSICAL EXAM   Vitals:   02/29/24 0933 02/29/24 0937  BP: 119/66 123/68  BP Location: Left upper arm Left upper arm  Patient Position: Sitting Standing  BP Cuff Size: Adult Adult  Pulse: 59 63  Weight: 81.2 kg (179 lb) Comment: pt reported   Height: 185.4 cm (6' 1)      General appearance - pleasant, appropriately dressed, groomed, and in no apparent distress. HEENT - Head Normocephalic, without obvious abnormality, atraumatic.  Chest - normal work of breathing without audible wheeze or coughing Heart - warm and well perfused Extremities - warm and well perfused and no cyanosis, clubbing or peripheral edema   Neurological Examination:  Mental Status: Alert and oriented to person, place, time. Memory and concentration intact. Good fund of knowledge. No aphasia.  Cranial nerves: EOMI w/o nystagmus.  Normal smooth pursuit and saccades facial sensation intact. Face symmetrical, hearing intact to voice, shoulder shrug good bilaterally, tongue protrudes midline, palate elevates symmetrically  Strength: Normal bulk. Full strength throughout upper and lower  extremities.  Sensory: intact to light touch in all four distal extremities. Romberg absent  Reflexes: 2+ in upper and lower extremities  Coordination: Finger-nose and heel-shin accurate without evidence of ataxia. No truncal ataxia.  Gait: Able to stand from seated with arms across chest.  Base, stride length and stride height is normal. gait is steady, mildly stooped with decreased right arm swing and RUE tremor.    Pull test: Negative  Movement: Mild hypomimia and hypophonia No postural or action tremor RUE distal resting tremor involving his index finger and thumb Finger taps, hand movements, rapid alternating movements, foot stomps intact with mild right-sided bradykinesia (decrement) Slightly increased tone in RUE No dyskinesia No dystonia  No tics No chorea No myoclonus No apraxia  Additional detail in UPDRS below.   Movement Disorder Society -Unified Parkinsons Disease Rating Scale  (MDS-UPDRS) Part 3:   On PD meds, 4.5 hours since last medications  3.1 Speech 1 = Slight loss of expression, diction and/or volume.  3.2  Facial expression 1 = Minimal hypomimia, could be normal Poker Face.  3.3a Rigidity - head/neck 0 = Absent.   3.3b Rigidity - RUE 1 = Slight or detectable only when activated by mirror or other movements.   3.3c Rigidity - LUE 0 = Absent.   3.3d Rigidity - RLE 0 = Absent.   3.3e Rigidity - LLE 0 = Absent.   3.4a Finger tapping - Right hand 1 = Slight, with any of the following: a) the regular rhythm is broken with one or two interruptions or hesitations of the tapping movement; b) slight slowing; c) the amplitude decrements near the end of the 10 taps.   3.4b Finger tapping - Left hand 0 = Normal.   3.5a Hand movements - Right hand 0 = Normal.   3.5b Hand movements -  Left hand 0 = Normal.   3.6a Pronation/Supination  - Right hand 0 = Normal.   3.6b Pronation/Supination  - Left hand 0 = Normal.   3.7a Toe tapping - Right foot 0 = Normal.    3.7b  Toe tapping - Left foot 0 = Normal.    3.8a Leg agility - Right leg 0 = Normal.   3.8b Leg agility - Left leg 0 = Normal.  3.9 Arising from chair 0 = Normal.   3.10 Gait 1 = Walks slowly, may shuffle with short steps, but no festination (hastening steps) or propulsion.   3.11 Freezing of gait 0 = Normal.  3.12 Postural stability 0 = Normal.   3.13 Posture 2 = Moderately stooped posture, definitely abnormal; can be slightly leaning to one side.   3.14 Global spontaneity of movement 1 = Slight global slowness and poverty of spontaneous movements.  3.15a Postural tremor - Right hand 0 = Absent.   3.15b Postural tremor - Left hand 0 = Absent.   3.16a Kinetic tremor - Right hand 0 = Absent.   3.16b Kinetic tremor - Left hand 0 = Absent.   3.17a Rest tremor - RUE 1 = Slight, less than 1 cm   3.17b Rest tremor - LUE 0 = Absent.   3.17c Rest tremor - RLE 0 = Absent.   3.17d Rest tremor - LLE 0 = Absent.   3.17e Rest tremor - Lip/jaw 0 = Absent.   3.18 Constancy of rest tremor 3 = Tremor is present 51-75% of exam period.       Diagnostic Studies and Review of Records:  Prior records reviewed and summarized in the HPI.  No results found for this or any previous visit.  Lab Results  Component Value Date   WBC 6.4 10/06/2023   HGB 15.0 10/06/2023   HCT 44.3 10/06/2023   MCV 91.5 10/06/2023   PLT 175 10/06/2023   Lab Results  Component Value Date   NA 142 10/06/2023   K 4.6 10/06/2023   CL 108 10/06/2023   CO2 26.9 10/06/2023   BUN 13 10/06/2023   CREATININE 0.8 10/06/2023   CALCIUM 9.3 10/06/2023   Lab Results  Component Value Date   ALKPHOS 61 10/06/2023   ALT 22 10/06/2023   AST 16 10/06/2023   No results found for: PT, INR, APTT Lab Results  Component Value Date   TSH 1.903 09/23/2023   No results found for: COPPER, ZINC  ASSESSMENT AND PLAN  Diagnoses addressed today: No diagnosis found.  Gregory Gilbert is a 73 year old right-handed man with history of  hypertension, hyperlipidemia, BPH Parkinson's disease referred for further evaluation of Parkinson's disease.  Onset of symptoms was 2023 with RUE tremor and subsequently mild changes in gait and he was recently diagnosed by Dr. Maree with PD 08/2023.  Agree with the diagnosis of PD, most likely idiopathic as there are no red flags for atypical parkinsonism.  He did not find benefit with Sinemet IR, but is finding Sinemet CR helpful for his RUE tremor. Nonmotor symptoms include RBD and anosmia. Examination shows mild right sided parkinsonism with rest tremor, bradykinesia and slight increased RUE tone.  Sinemet CR has been helpful but he is wearing off after 3 hours, we discussed increasing frequency of dose to 4 times daily dosing.  Discussed melatonin for RBD and will check iron studies for RLS symptoms.  He is interested in the benchmark multidisciplinary clinic so we will have him follow-up in 6 months for  benchmark visit.  # Parkinson's disease with mild motor fluctuations and no dyskinesias - Increase Sinemet CR 50/200 to 4 times daily dosing - Discussed that he can take his last dose at bedtime to help with RLS symptoms - Return in 4-6 months for follow-up with multidisciplinary 5 clinic - Discussed clinical trials with patient and - Continue daily exercise  # RLS - Check iron studies - Will prescribe iron if ferritin is low, other options would be gabapentinoid if symptoms remain bothersome  # RBD - Start with melatonin 5-10 mg at bedtime, can uptitrate further if needed.  Clonazepam can be considered if refractory to melatonin   Patient seen and discussed with Dr. Pablo who agrees with plan  Viktoria Eth MD PhD PGY 5 movement disorder fellow   Medications and Orders Requested Prescriptions    No prescriptions requested or ordered in this encounter    No orders of the defined types were placed in this encounter.   Patient Instructions There are no Patient Instructions on  file for this visit.  Follow Up: No follow-ups on file.

## 2024-04-06 ENCOUNTER — Ambulatory Visit: Payer: Self-pay | Admitting: Urology

## 2024-04-06 ENCOUNTER — Encounter: Payer: Self-pay | Admitting: Urology

## 2024-04-06 VITALS — BP 134/72 | HR 79 | Ht 73.0 in | Wt 180.0 lb

## 2024-04-06 DIAGNOSIS — N401 Enlarged prostate with lower urinary tract symptoms: Secondary | ICD-10-CM

## 2024-04-06 LAB — BLADDER SCAN AMB NON-IMAGING: Scan Result: 39

## 2024-04-06 MED ORDER — DUTASTERIDE 0.5 MG PO CAPS: 0.5000 mg | ORAL_CAPSULE | Freq: Every day | ORAL | 3 refills | Status: AC

## 2024-04-06 MED ORDER — TADALAFIL 5 MG PO TABS: 5.0000 mg | ORAL_TABLET | Freq: Every day | ORAL | 3 refills | Status: AC

## 2024-04-06 NOTE — Progress Notes (Signed)
 04/06/2024 1:00 PM   Gregory Gilbert [birth year] [id number]  Referring provider: Diedra Lame, MD [phone] [address] [facility] [place],  [place] [address]  Chief Complaint  Patient presents with   Benign Prostatic Hypertrophy   Urologic history: 1.  BPH with LUTS Prior medical management UroLift 02/2018 with initial marked improvement in his voiding symptoms however symptoms gradually return 4 weeks after the procedure Tadalafil  5 mg daily   2.  Erectile dysfunction Tadalafil  5 mg daily   3.  History hydrocelectomy 2012   HPI: Gregory Gilbert is a 73 y.o. male presents for annual follow-up.  Since last visit has noted some decreased force and caliber of his urinary stream Was diagnosed with Parkinson's last year and does note increased urinary frequency but no significant urgency or urge incontinence Remains on tadalafil  and does feel he voids better while taking this medication Denies dysuria, gross hematuria No flank, abdominal or pelvic pain PSA 6 months ago had increased above baseline at 3.45 and he states he is scheduled to have this repeated tomorrow.  Baseline PSA is in 1.5-2 range   PMH: Past Medical History:  Diagnosis Date   Benign essential hypertension 09/21/2013   Chronic prostatitis 02/14/2010   ED (erectile dysfunction) of organic origin 09/21/2013   Enlarged prostate with lower urinary tract symptoms (LUTS) 02/14/2010   Erectile dysfunction 09/21/2013   Hyperlipemia 09/21/2013   Hypertrophy of prostate without urinary obstruction and other lower urinary tract symptoms (LUTS) 09/22/2013    Surgical History: Past Surgical History:  Procedure Laterality Date   CATARACT EXTRACTION W/PHACO Right 07/09/2020   Procedure: CATARACT EXTRACTION PHACO AND INTRAOCULAR LENS PLACEMENT (IOC) RIGHT 3.46 00:28.6;  Surgeon: Myrna Adine Anes, MD;  Location: [facility];  Service: Ophthalmology;  Laterality: Right;    CERVICAL DISC SURGERY  2009   6&7   CYSTOSCOPY WITH INSERTION OF UROLIFT N/A 03/16/2018   Procedure: CYSTOSCOPY WITH INSERTION OF UROLIFT;  Surgeon: Twylla Glendia BROCKS, MD;  Location: [facility];  Service: Urology;  Laterality: N/A;   ELBOW ARTHROSCOPY Right 2007   HYDROCELE EXCISION  2012   INGUINAL HERNIA REPAIR  2011    Home Medications:  Allergies as of 04/06/2024       Reactions   Oxycodone    Skin crawls   Sulfa Antibiotics Nausea Only        Medication List        Accurate as of April 06, 2024  1:00 PM. If you have any questions, ask your nurse or doctor.          STOP taking these medications    L-Lysine 500 MG Tabs Stopped by: Glendia BROCKS Ersa Delaney   naproxen sodium 220 MG tablet Commonly known as: ALEVE Stopped by: Glendia BROCKS Twylla   simvastatin 20 MG tablet Commonly known as: ZOCOR Stopped by: Maciah Schweigert C Mylia Pondexter       TAKE these medications    aspirin EC 81 MG tablet Take 81 mg by mouth daily.   atorvastatin 10 MG tablet Commonly known as: LIPITOR Take 10 mg by mouth daily.   lisinopril  20 MG tablet Commonly known as: ZESTRIL  Take 1 tablet (20 mg total) by mouth daily.   meloxicam 15 MG tablet Commonly known as: MOBIC Take 15 mg by mouth.   psyllium 58.6 % packet Commonly known as: METAMUCIL Take 1 packet by mouth daily.   tadalafil  5 MG tablet Commonly known as: CIALIS  Take 1 tablet (5  mg total) by mouth daily.   Vitamin D-1000 Max St 25 MCG (1000 UT) tablet Generic drug: Cholecalciferol Take 1,000 Units by mouth. What changed: Another medication with the same name was removed. Continue taking this medication, and follow the directions you see here. Changed by: Glendia JAYSON Barba        Allergies:  Allergies  Allergen Reactions   Oxycodone     Skin crawls   Sulfa Antibiotics Nausea Only    Family History: Family History  Problem Relation Age of Onset   Bladder Cancer Neg Hx    Kidney cancer Neg Hx    Prostate cancer Neg  Hx     Social History:  reports that he has never smoked. He has never used smokeless tobacco. He reports current alcohol use of about 10.0 standard drinks of alcohol per week. He reports that he does not use drugs.   Physical Exam: BP 134/72   Pulse 79   Ht 6' 1 (1.854 m)   Wt 180 lb (81.6 kg)   BMI 23.75 kg/m   Constitutional:  Alert, No acute distress. HEENT: [place] AT Respiratory: Normal respiratory effort, no increased work of breathing. Psychiatric: Normal mood and affect.   Assessment & Plan:    1. Benign prostatic hyperplasia with LUTS PVR today 39 mL Worsening obstructive voiding symptoms Discussed starting a 5-ARI.  Potential side effects were discussed including decreased semen volume and post finasteride syndrome We discussed that we will take at least 6 months to determine efficacy on this medication Rx dutasteride 0.5 mg daily sent to pharmacy Tadalafil  refilled    Glendia JAYSON Barba, MD  [facility] [address], [address] [place], [place] [address] [phone]

## 2025-04-07 ENCOUNTER — Ambulatory Visit: Admitting: Urology
